# Patient Record
Sex: Female | Born: 1975
Health system: Southern US, Community
[De-identification: ages and names within clinical notes are randomized; demographics above are authoritative.]

## PROBLEM LIST (undated history)

## (undated) DIAGNOSIS — A630 Anogenital (venereal) warts: Secondary | ICD-10-CM

## (undated) DIAGNOSIS — J302 Other seasonal allergic rhinitis: Secondary | ICD-10-CM

## (undated) DIAGNOSIS — T7840XA Allergy, unspecified, initial encounter: Secondary | ICD-10-CM

## (undated) DIAGNOSIS — K2960 Other gastritis without bleeding: Secondary | ICD-10-CM

## (undated) DIAGNOSIS — K648 Other hemorrhoids: Secondary | ICD-10-CM

## (undated) DIAGNOSIS — K219 Gastro-esophageal reflux disease without esophagitis: Secondary | ICD-10-CM

## (undated) HISTORY — DX: Gastro-esophageal reflux disease without esophagitis: K21.9

## (undated) HISTORY — DX: Other gastritis without bleeding: K29.60

## (undated) HISTORY — DX: Anogenital (venereal) warts: A63.0

## (undated) HISTORY — DX: Other seasonal allergic rhinitis: J30.2

## (undated) HISTORY — DX: Allergy, unspecified, initial encounter: T78.40XA

## (undated) HISTORY — DX: Other hemorrhoids: K64.8

## (undated) HISTORY — PX: WISDOM TOOTH EXTRACTION: SHX21

## (undated) HISTORY — PX: TONSILLECTOMY: SHX5217

---

## 2006-04-02 ENCOUNTER — Other Ambulatory Visit: Admission: RE | Admit: 2006-04-02 | Discharge: 2006-04-02 | Payer: Self-pay | Admitting: Family Medicine

## 2007-04-05 ENCOUNTER — Other Ambulatory Visit: Admission: RE | Admit: 2007-04-05 | Discharge: 2007-04-05 | Payer: Self-pay | Admitting: Family Medicine

## 2008-04-19 ENCOUNTER — Other Ambulatory Visit: Admission: RE | Admit: 2008-04-19 | Discharge: 2008-04-19 | Payer: Self-pay | Admitting: Family Medicine

## 2008-04-24 HISTORY — PX: CERVICAL POLYPECTOMY: SHX88

## 2009-04-23 ENCOUNTER — Other Ambulatory Visit: Admission: RE | Admit: 2009-04-23 | Discharge: 2009-04-23 | Payer: Self-pay | Admitting: Family Medicine

## 2009-05-03 DIAGNOSIS — A630 Anogenital (venereal) warts: Secondary | ICD-10-CM

## 2009-05-03 HISTORY — DX: Anogenital (venereal) warts: A63.0

## 2010-04-29 ENCOUNTER — Other Ambulatory Visit: Admission: RE | Admit: 2010-04-29 | Discharge: 2010-04-29 | Payer: Self-pay | Admitting: Family Medicine

## 2011-04-28 ENCOUNTER — Encounter: Payer: Self-pay | Admitting: Family Medicine

## 2011-04-28 ENCOUNTER — Ambulatory Visit (INDEPENDENT_AMBULATORY_CARE_PROVIDER_SITE_OTHER): Payer: 59 | Admitting: Family Medicine

## 2011-04-28 ENCOUNTER — Other Ambulatory Visit (HOSPITAL_COMMUNITY)
Admission: RE | Admit: 2011-04-28 | Discharge: 2011-04-28 | Disposition: A | Discharge status: Home / Self Care | Payer: 59 | Source: Ambulatory Visit | Attending: Family Medicine | Admitting: Family Medicine

## 2011-04-28 VITALS — BP 104/70 | HR 68 | Ht 63.0 in | Wt 105.0 lb

## 2011-04-28 DIAGNOSIS — Z0000 Encounter for general adult medical examination without abnormal findings: Secondary | ICD-10-CM

## 2011-04-28 DIAGNOSIS — Z01419 Encounter for gynecological examination (general) (routine) without abnormal findings: Secondary | ICD-10-CM | POA: Insufficient documentation

## 2011-04-28 LAB — POCT URINALYSIS DIPSTICK
Bilirubin, UA: NEGATIVE
Glucose, UA: NEGATIVE
Leukocytes, UA: NEGATIVE
Nitrite, UA: NEGATIVE

## 2011-04-28 LAB — HM PAP SMEAR: HM Pap smear: NORMAL

## 2011-04-28 NOTE — Progress Notes (Signed)
Katherine Macias is a 35 y.o. female who presents for a complete physical.  She has no specific complaints or concerns  There is no immunization history on file for this patient. TdaP approx 7/06 per pt (through the hospital; [facility] records not yet received) Last Pap smear: 04/2010 Last mammogram: never Last colonoscopy: n/a Last DEXA: n/a Dentist: every 6 months Ophtho: >5 years Exercise: rides bike 2 hours twice a week, and runs 6-7 miles twice a week  History reviewed. No pertinent past medical history.  Past Surgical History  Procedure Date  . Wisdom tooth extraction     History   Social History  . Marital Status: Married    Spouse Name: N/A    Number of Children: 0  . 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 Smoking status: Never Smoker   . Smokeless tobacco: Never Used  . Alcohol Use: Yes     1-2 glasses, 1-2 times per week  . Drug Use: No  . Sexually Active: Yes    Birth Control/ Protection: Other-see comments     husband s/p vasectomy   Other Topics Concern  . Not on file   Social History Narrative  . No narrative on file    Family History  Problem Relation Age of Onset  . Hypertension Mother   . Cancer Mother 26    breast cancer  . Hypertension Father   . Cancer Paternal Aunt     breast  . Diabetes Paternal Grandmother   . Cancer Paternal Aunt     breast    Current outpatient prescriptions:Calcium Carbonate-Vitamin D (CALCIUM + D) 600-200 MG-UNIT TABS, Take 1 tablet by mouth daily.  , Disp: , Rfl: ;  Multiple Vitamins-Calcium (ONE-A-DAY WOMENS FORMULA PO), Take 1 tablet by mouth daily.  , Disp: , Rfl:   No Known Allergies  ROS: The patient denies anorexia, fever, weight changes, headaches,  vision changes, decreased hearing, ear pain, sore throat, breast concerns, chest pain, palpitations, dizziness, syncope, dyspnea on exertion, cough, swelling, nausea, vomiting, diarrhea, constipation,  abdominal pain, melena, hematochezia, indigestion/heartburn, hematuria, incontinence, dysuria, irregular menstrual cycles, vaginal discharge, odor or itch, genital lesions, joint pains, numbness, tingling, weakness, tremor, suspicious skin lesions, depression, anxiety, abnormal bleeding/bruising, or enlarged lymph nodes.  PHYSICAL EXAM: BP 104/70  Pulse 68  Ht 5\' 3"  (1.6 m)  Wt 105 lb (47.628 kg)  BMI 18.60 kg/m2  LMP 04/07/2011  General Appearance:    Alert, cooperative, no distress, appears stated age  Head:    Normocephalic, without obvious abnormality, atraumatic  Eyes:    PERRL, conjunctiva/corneas clear, EOM's intact, fundi    benign  Ears:    Normal TM's and external ear canals  Nose:   Nares normal, mucosa normal, no drainage or sinus   tenderness  Throat:   Lips, mucosa, and tongue normal; teeth and gums normal  Neck:   Supple, no lymphadenopathy;  thyroid:  no   enlargement/tenderness/nodules; no carotid   bruit or JVD  Back:    Spine nontender, no curvature, ROM normal, no CVA     tenderness  Lungs:     Clear to auscultation bilaterally without wheezes, rales or     ronchi; respirations unlabored  Chest Wall:    No tenderness or deformity   Heart:    Regular rate and rhythm, S1 and S2 normal, no murmur, rub   or gallop  Breast Exam:  No tenderness, masses, or nipple discharge or inversion.      No axillary lymphadenopathy  Abdomen:     Soft, non-tender, nondistended, normoactive bowel sounds,    no masses, no hepatosplenomegaly  Genitalia:    Normal external genitalia without lesions.  BUS and vagina normal; cervix with 1mm polyp anteriorly, no other lesions, no cervical motion tenderness. No abnormal vaginal discharge.  Uterus and adnexa not enlarged, nontender, no masses.  Pap performed  Rectal:    Not performed due to age<40 and no related complaints  Extremities:   No clubbing, cyanosis or edema  Pulses:   2+ and symmetric all extremities  Skin:   Skin color, texture,  turgor normal, no rashes or lesions.  A few prominent moles, but no atypical features  Lymph nodes:   Cervical, supraclavicular, and axillary nodes normal  Neurologic:   CNII-XII intact, normal strength, sensation and gait; reflexes 2+ and symmetric throughout          Psych:   Normal mood, affect, hygiene and grooming.     ASSESSMENT/PLAN: 1. Routine general medical examination at a health care facility  POCT urinalysis dipstick, Visual acuity screening, Cytology - PAP    Discussed monthly self breast exams, baseline mammo at age 23, and yearly mammograms after the age of 3; at least 30 minutes of aerobic activity at least 5 days/week; proper sunscreen use reviewed; healthy diet, including goals of calcium and vitamin D intake and alcohol recommendations (less than or equal to 1 drink/day) reviewed; regular seatbelt use; changing batteries in smoke detectors.  Immunization recommendations discussed, UTD.  Colonoscopy recommendations reviewed (age 68).  Recommend routine eye exam

## 2011-04-30 ENCOUNTER — Encounter: Payer: Self-pay | Admitting: Family Medicine

## 2011-05-01 ENCOUNTER — Encounter: Payer: Self-pay | Admitting: Family Medicine

## 2012-04-27 ENCOUNTER — Encounter: Payer: Self-pay | Admitting: Internal Medicine

## 2012-05-05 ENCOUNTER — Encounter: Payer: Self-pay | Admitting: Family Medicine

## 2012-05-05 ENCOUNTER — Ambulatory Visit (INDEPENDENT_AMBULATORY_CARE_PROVIDER_SITE_OTHER): Payer: 59 | Admitting: Family Medicine

## 2012-05-05 VITALS — BP 102/72 | HR 80 | Ht 63.0 in | Wt 105.0 lb

## 2012-05-05 DIAGNOSIS — K219 Gastro-esophageal reflux disease without esophagitis: Secondary | ICD-10-CM

## 2012-05-05 DIAGNOSIS — Z0000 Encounter for general adult medical examination without abnormal findings: Secondary | ICD-10-CM

## 2012-05-05 DIAGNOSIS — G4700 Insomnia, unspecified: Secondary | ICD-10-CM

## 2012-05-05 LAB — POCT URINALYSIS DIPSTICK
Leukocytes, UA: NEGATIVE
Protein, UA: NEGATIVE
Spec Grav, UA: 1.02
Urobilinogen, UA: NEGATIVE

## 2012-05-05 MED ORDER — ZOLPIDEM TARTRATE 10 MG PO TABS: ORAL_TABLET | ORAL | Status: DC

## 2012-05-05 NOTE — Progress Notes (Signed)
Katherine Macias is a 36 y.o. female who presents for a complete physical.  She has the following concerns:  Sometimes has trouble sleeping.  Sometimes comes in spurts, and other times can be about once a week.  Took melatonin for a year, and it helped her sleep during the day when she was working nights, but didn't really help with this intermittent insomnia.  Sometimes is related to anxiety, ie if has a presentation the following day, or lots to do. OTC agents sometimes help, but not when she is very anxious.   She is also reporting some "nonspecific abdominal pain" for 5 months.  Bad for a few days, then better for a week or two, seems to come and go.  The longest is was "bad" was for 5 days.  Discomfort is epigastric.  Sometimes feels crampy, and sometimes radiates to her back.  Other times the pain is burning, but doesn't radiate up into chest, mostly stays in upper stomach.  Worse after eating, and sometimes has nausea and bloating after eating, feels very full, but resolves prior to her next meal.  Health Maintenance: Immunization History  Administered Date(s) Administered  . Td 04/03/2004  Tetanus was given through employee health at hospital (so unclear which type of tetanus was actually given, TD documented in [facility] records, but may not be accurate) gets flu shots yearly through work Last Pap smear: 04/2011 Last mammogram: never Last colonoscopy: n/a Last DEXA: n/a Dentist: every 6 months Ophtho: >6 years Exercise: 4 days/week (running and biking, occasional weights) Lipids 12/07 were excellent (total 146, HDL 83, LDL 48, TG 75) Normal vitamin D 04/2009 (44)  Past Medical History  Diagnosis Date  . Seasonal allergies   . Genital warts 05/2009    treated with liquid nitrogen    Past Surgical History  Procedure Date  . Wisdom tooth extraction   . Tonsillectomy   . Cervical polypectomy 04/24/08    benign    History   Social History  . Marital Status: Married    Spouse Name: N/A     Number of Children: 0  . 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 Smoking status: Never Smoker   . Smokeless tobacco: Never Used  . Alcohol Use: Yes     1-2 glasses, 1-2 times per week  . Drug Use: No  . Sexually Active: Yes    Birth Control/ Protection: Other-see comments     husband s/p vasectomy   Other Topics Concern  . Not on file   Social History Narrative   2 stepchildren    Family History  Problem Relation Age of Onset  . Hypertension Mother   . Cancer Mother 109    breast cancer  . Hypertension Father   . Cancer Paternal Aunt     breast  . Diabetes Paternal Grandmother   . Cancer Paternal Aunt     breast  . Cancer Cousin 33    breast    Current outpatient prescriptions:Calcium-Phosphorus-Vitamin D (CALCIUM GUMMIES PO), Take 1 each by mouth daily., Disp: , Rfl: ;  cetirizine (ZYRTEC) 10 MG tablet, Take 10 mg by mouth daily., Disp: , Rfl: ;  Multiple Vitamins-Calcium (ONE-A-DAY WOMENS FORMULA PO), Take 1 tablet by mouth daily.  , Disp: , Rfl: ;  zolpidem (AMBIEN) 10 MG tablet, 1/2 - 1 tablet at bedtime as needed for insomnia, Disp: 30 tablet, Rfl: 0  No Known Allergies  ROS:  The patient denies anorexia, fever, weight changes, headaches,  vision changes, decreased hearing, ear pain, sore throat, breast concerns, chest pain, palpitations, dizziness, syncope, dyspnea on exertion, cough, swelling, vomiting, diarrhea, constipation, melena, hematochezia, hematuria, incontinence, dysuria, irregular menstrual cycles, vaginal discharge, odor or itch, genital lesions, joint pains, numbness, tingling, weakness, tremor, suspicious skin lesions, depression, abnormal bleeding/bruising, or enlarged lymph nodes. Allergies--controlled by zyrtec (taken prn). +nausea occasionally after meals.  See HPI/above  PHYSICAL EXAM: BP 102/72  Pulse 80  Ht 5\' 3"  (1.6 m)  Wt 105 lb (47.628 kg)  BMI 18.60 kg/m2  LMP  04/26/2012  General Appearance:    Alert, cooperative, no distress, appears stated age  Head:    Normocephalic, without obvious abnormality, atraumatic  Eyes:    PERRL, conjunctiva/corneas clear, EOM's intact, fundi    benign  Ears:    Normal TM's and external ear canals  Nose:   Nares normal, mucosa normal, no drainage or sinus   tenderness  Throat:   Lips, mucosa, and tongue normal; teeth and gums normal  Neck:   Supple, no lymphadenopathy;  thyroid:  no   enlargement/tenderness/nodules; no carotid   bruit or JVD  Back:    Spine nontender, no curvature, ROM normal, no CVA     tenderness  Lungs:     Clear to auscultation bilaterally without wheezes, rales or     ronchi; respirations unlabored  Chest Wall:    No tenderness or deformity   Heart:    Regular rate and rhythm, S1 and S2 normal, no murmur, rub   or gallop  Breast Exam:    No tenderness, masses, or nipple discharge or inversion.      No axillary lymphadenopathy  Abdomen:     Soft, non-tender, nondistended, normoactive bowel sounds,    no masses, no hepatosplenomegaly  Genitalia:    Normal external genitalia without lesions.  BUS and vagina normal; no cervical motion tenderness. No abnormal vaginal discharge.  Uterus and adnexa not enlarged, nontender, no masses.  Pap not performed  Rectal:    Not performed due to age<40 and no related complaints  Extremities:   No clubbing, cyanosis or edema  Pulses:   2+ and symmetric all extremities  Skin:   Skin color, texture, turgor normal, no rashes or lesions  Lymph nodes:   Cervical, supraclavicular, and axillary nodes normal  Neurologic:   CNII-XII intact, normal strength, sensation and gait; reflexes 2+ and symmetric throughout          Psych:   Normal mood, affect, hygiene and grooming.    ASSESSMENT/PLAN:  1. Routine general medical examination at a health care facility  POCT Urinalysis Dipstick, Visual acuity screening  2. Insomnia  zolpidem (AMBIEN) 10 MG tablet  3. GERD  (gastroesophageal reflux disease)     Insomnia, intermittent--discussed ambien vs xanax, given that there is some component of anxiety contributing to inability to fall asleep.  Will try Ambien first, starting at 1/2 tablet prn.  Call for xanax if ambien not effective, or causing side effects. Risks of xanax and ambien reviewed.  GI complaints--most consistent with reflux.  Dietary trial.  Consider use of PPI or H2 blocker prior to meals known to cause symptoms.  Briefly reviewed symptoms of gallstones as well.  Seems localized to upper abdomen, so not likely other causes (gluten intolerance or lactose intolerance).  Recommended keeping track of foods to see which seem to cause the most symptoms, and to avoid.  Discussed monthly self breast  exams and yearly mammograms after the age of 41; advised to get baseline now, given family h/o breast cancer; at least 30 minutes of aerobic activity at least 5 days/week; proper sunscreen use reviewed; healthy diet, including goals of calcium and vitamin D intake and alcohol recommendations (less than or equal to 1 drink/day) reviewed; regular seatbelt use; changing batteries in smoke detectors.  Immunization recommendations discussed--check with employee health re: type and date of last tetanus shot.  Colonoscopy recommendations reviewed--age 60

## 2012-05-05 NOTE — Patient Instructions (Addendum)
HEALTH MAINTENANCE RECOMMENDATIONS:  It is recommended that you get at least 30 minutes of aerobic exercise at least 5 days/week (for weight loss, you may need as much as 60-90 minutes). This can be any activity that gets your heart rate up. This can be divided in 10-15 minute intervals if needed, but try and build up your endurance at least once a week.  Weight bearing exercise is also recommended twice weekly.  Eat a healthy diet with lots of vegetables, fruits and fiber.  "Colorful" foods have a lot of vitamins (ie green vegetables, tomatoes, red peppers, etc).  Limit sweet tea, regular sodas and alcoholic beverages, all of which has a lot of calories and sugar.  Up to 1 alcoholic drink daily may be beneficial for women (unless trying to lose weight, watch sugars).  Drink a lot of water.  Calcium recommendations are 1200-1500 mg daily (1500 mg for postmenopausal women or women without ovaries), and vitamin D 1000 IU daily.  This should be obtained from diet and/or supplements (vitamins), and calcium should not be taken all at once, but in divided doses.  Monthly self breast exams and yearly mammograms for women over the age of 77 is recommended.  Sunscreen of at least SPF 30 should be used on all sun-exposed parts of the skin when outside between the hours of 10 am and 4 pm (not just when at beach or pool, but even with exercise, golf, tennis, and yard work!)  Use a sunscreen that says "broad spectrum" so it covers both UVA and UVB rays, and make sure to reapply every 1-2 hours.  Remember to change the batteries in your smoke detectors when changing your clock times in the spring and fall.  Use your seat belt every time you are in a car, and please drive safely and not be distracted with cell phones and texting while driving.  Please check with employee health regarding your tetanus vaccine--was it Td or TdaP.  If it was plain tetanus (Td), then TdaP booster is recommended, and nurse visit can be  scheduled.  If you had TdaP, then booster isn't needed until 2015.  Diet for GERD or PUD Nutrition therapy can help ease the discomfort of gastroesophageal reflux disease (GERD) and peptic ulcer disease (PUD).  HOME CARE INSTRUCTIONS   Eat your meals slowly, in a relaxed setting.   Eat 5 to 6 small meals per day.   If a food causes distress, stop eating it for a period of time.  FOODS TO AVOID  Coffee, regular or decaffeinated.   Cola beverages, regular or low calorie.   Tea, regular or decaffeinated.   Pepper.   Cocoa.   High fat foods, including meats.   Butter, margarine, hydrogenated oil (trans fats).   Peppermint or spearmint (if you have GERD).   Fruits and vegetables if not tolerated.   Alcohol.   Nicotine (smoking or chewing). This is one of the most potent stimulants to acid production in the gastrointestinal tract.   Any food that seems to aggravate your condition.  If you have questions regarding your diet, ask your caregiver or a registered dietitian. TIPS  Lying flat may make symptoms worse. Keep the head of your bed raised 6 to 9 inches (15 to 23 cm) by using a foam wedge or blocks under the legs of the bed.   Do not lay down until 3 hours after eating a meal.   Daily physical activity may help reduce symptoms.  MAKE SURE YOU:  Understand these instructions.   Will watch your condition.   Will get help right away if you are not doing well or get worse.  Document Released: 10/20/2005 Document Revised: 10/09/2011 Document Reviewed: 09/05/2011 [facility] Patient Information 2012 [place], [place].  Consider use of PPI or H2 blocker prior to meals known to cause symptoms.

## 2012-05-18 ENCOUNTER — Other Ambulatory Visit: Payer: Self-pay | Admitting: Family Medicine

## 2012-05-18 DIAGNOSIS — Z1231 Encounter for screening mammogram for malignant neoplasm of breast: Secondary | ICD-10-CM

## 2012-05-18 DIAGNOSIS — Z803 Family history of malignant neoplasm of breast: Secondary | ICD-10-CM

## 2012-05-31 ENCOUNTER — Ambulatory Visit
Admission: RE | Admit: 2012-05-31 | Discharge: 2012-05-31 | Disposition: A | Discharge status: Home / Self Care | Payer: 59 | Source: Ambulatory Visit | Attending: Family Medicine | Admitting: Family Medicine

## 2012-05-31 DIAGNOSIS — Z1231 Encounter for screening mammogram for malignant neoplasm of breast: Secondary | ICD-10-CM

## 2012-05-31 DIAGNOSIS — Z803 Family history of malignant neoplasm of breast: Secondary | ICD-10-CM

## 2012-06-24 ENCOUNTER — Ambulatory Visit (INDEPENDENT_AMBULATORY_CARE_PROVIDER_SITE_OTHER): Payer: 59 | Admitting: Family Medicine

## 2012-06-24 ENCOUNTER — Encounter: Payer: Self-pay | Admitting: Family Medicine

## 2012-06-24 VITALS — BP 116/78 | HR 80 | Ht 63.0 in | Wt 107.0 lb

## 2012-06-24 DIAGNOSIS — K219 Gastro-esophageal reflux disease without esophagitis: Secondary | ICD-10-CM

## 2012-06-24 DIAGNOSIS — R1084 Generalized abdominal pain: Secondary | ICD-10-CM | POA: Insufficient documentation

## 2012-06-24 DIAGNOSIS — G4700 Insomnia, unspecified: Secondary | ICD-10-CM

## 2012-06-24 DIAGNOSIS — R109 Unspecified abdominal pain: Secondary | ICD-10-CM

## 2012-06-24 DIAGNOSIS — J309 Allergic rhinitis, unspecified: Secondary | ICD-10-CM | POA: Insufficient documentation

## 2012-06-24 DIAGNOSIS — R51 Headache: Secondary | ICD-10-CM

## 2012-06-24 NOTE — Patient Instructions (Signed)
Lactose-Free Diet Lactose is a carbohydrate that is found mainly in milk and milk products, as well as in foods with added milk or whey. Lactose must be digested by the enzyme lactase in order to be used by the body. Lactose intolerance occurs when there is a shortage of lactase. When your body is not able to digest lactose, you may feel sick to your stomach (nausea), bloated, and have cramps, gas, and diarrhea. TYPES OF LACTASE DEFICIENCY  Primary lactase deficiency. This is the most common type. It is characterized by a slow decrease in lactase activity.   Secondary lactase deficiency. This occurs following injury to the small intestinal mucosa as a result of a disease or condition. It can also occur as a result of surgery or after treatment with antibiotic medicines or cancer drugs.  Tolerance to lactose varies widely. Each person must determine how much milk can be consumed without developing symptoms. Drinking smaller portions of milk throughout the day may be helpful. Some studies suggest that slowing gastric emptying may help increase tolerance of milk products. This may be done by:  Consuming milk or milk products with a meal rather than alone.   Consuming milk with a higher fat content.  There are many dairy products that may be tolerated better than milk by some people, including:  Cheese (especially aged cheese). The lactose content is much lower than in milk.   Cultured dairy products, such as yogurt, buttermilk, cottage cheese, and sweet acidophilus milk (kefir). These products are usually well tolerated by lactase-deficient people. This is because the healthy bacteria help digest lactose.   Lactose-hydrolyzed milk. This product contains 40% to 90% less lactose than milk and may also be well tolerated.  ADEQUACY These diets may be deficient in calcium, riboflavin, and vitamin D, according to the Recommended Dietary Allowances of the [employer]. Depending on individual  tolerances and the use of milk substitutes, milk, or other dairy products, you may be able to meet these recommendations. SPECIAL NOTES  Lactose is a carbohydrate. The main food source for lactose is dairy products. Reading food labels is important. Many products contain lactose even when they are not made from milk. Look for the following words: whey, milk solids, dry milk solids, nonfat dry milk powder. Typical sources of lactose other than dairy products include breads, candies, cold cuts, prepared and processed foods, and commercial sauces and gravies.   All foods must be prepared without milk, cream, or other dairy foods.   A vitamin or mineral supplement may be necessary. Consult your caregiver or Registered Dietitian.   Lactose is also found in many prescription and over-the-counter medicines.   Soy milk and lactose-free supplements may be used as an alternative to milk.  CHOOSING FOODS Breads and Starches  Allowed: Breads and rolls made without milk. [place], [place], or [place] bread. Soda crackers, graham crackers. Any crackers prepared without lactose. Cooked or dry cereals prepared without lactose (read labels). Any potatoes, pasta, or rice prepared without milk or lactose. Popcorn.   Avoid: Breads and rolls that contain milk. Prepared mixes such as muffins, biscuits, waffles, pancakes. Sweet rolls, donuts, [place] toast (if made with milk or lactose). Zwieback crackers, corn curls, or any crackers that contain lactose. Cooked or dry cereals prepared with lactose (read labels). Instant potatoes, frozen [place] fries, scalloped or au gratin potatoes.  Vegetables  Allowed: Fresh, frozen, and canned vegetables.   Avoid: Creamed or breaded vegetables. Vegetables in a cheese sauce or with lactose-containing margarines.  Fruit  Allowed: All fresh, canned, or frozen fruits that are not processed with lactose.   Avoid: Any canned or frozen fruits processed with lactose.  Meat and Meat  Substitutes  Allowed: Plain beef, chicken, fish, [place], lamb, veal, pork, or ham. Kosher prepared meat products. Strained or junior meats that do not contain milk. Eggs, soy meat substitutes, nuts.   Avoid: Scrambled eggs, omelets, and souffles that contain milk. Creamed or breaded meat, fish, or fowl. Sausage products such as wieners, liver sausage, or cold cuts that contain milk solids. Cheese, cottage cheese, or cheese spreads.  Milk  Allowed: None.   Avoid: Milk (whole, 2%, skim, or chocolate). Evaporated, powdered, or condensed milk. Malted milk.  Soups and Combination Foods  Allowed: Bouillon, broth, vegetable soups, clear soups, consomms. Homemade soups made with allowed ingredients. Combination or prepared foods that do not contain milk or milk products (read labels).   Avoid: Cream soups, chowders, commercially prepared soups containing lactose. Macaroni and cheese, pizza. Combination or prepared foods that contain milk or milk products.  Desserts and Sweets  Allowed: Water and fruit ices, gelatin, angel food cake. Homemade cookies, pies, or cakes made from allowed ingredients. Pudding (if made with water or a milk substitute). Lactose-free tofu desserts. Sugar, honey, corn syrup, jam, jelly, marmalade, molasses (beet sugar). Pure sugar candy, marshmallows.   Avoid: Ice cream, ice milk, sherbet, custard, pudding, frozen yogurt. Commercial cake and cookie mixes. Desserts that contain chocolate. Pie crust made with milk-containing margarine. Reduced calorie desserts made with a sugar substitute that contains lactose. Toffee, peppermint, butterscotch, chocolate, caramels.  Fats and Oils  Allowed: Butter (as tolerated, contains very small amounts of lactose). Margarines and dressings that do not contain milk. Vegetable oils, shortening, mayonnaise, nondairy cream and whipped toppings without lactose or milk solids added. Tomasa Blase.   Avoid: Margarines and salad dressings containing milk.  Cream, cream cheese, peanut butter with added milk solids, sour cream, chip dips made with sour cream.  Beverages  Allowed: Carbonated drinks, tea, coffee and freeze-dried coffee, some instant coffees (check labels). Fruit drinks, fruit and vegetable juice, rice or soy milk.   Avoid: Hot chocolate. Some cocoas, some instant coffees, instant iced teas, powdered fruit drinks (read labels).  Condiments  Allowed: Soy sauce, carob powder, olives, gravy made with water, baker's cocoa, pickles, pure seasonings and spices, wine, pure monosodium glutamate, catsup, mustard.   Avoid: Some chewing gums, chocolate, some cocoas. Certain antibiotics and vitamin or mineral preparations. Spice blends if they contain milk products. MSG extender. Artificial sweeteners that contain lactose. Some nondairy creamers (read labels).  SAMPLE MENU Breakfast  Orange juice.   Banana.   Bran cereal.   Nondairy creamer.   [place]ienna bread, toasted.   Butter or milk-free margarine.   Coffee or tea.  Lunch  Chicken breast.   Rice.   Green beans.   Butter or milk-free margarine.   Fresh melon.   Coffee or tea.  Dinner  [employer]ing.   Baked potato.   Butter or milk-free margarine.   Broccoli.   Lettuce salad with vinegar and oil dressing.   [employer].   Coffee or tea.  Document Released: 04/11/2002 Document Revised: 10/09/2011 Document Reviewed: 01/17/2011 [facility] Patient Information 2012 [place], [place].   Gluten-Free Diet Gluten is a protein found in many grains. Gluten is present in wheat, rye, and barley. Gluten may cause intestinal injury when ingested by people who are sensitive to gluten. A tissue sample (biopsy) of the small intestine is  usually required for a positive diagnosis of gluten sensitivity. Dietary treatment consists of eliminating foods and food ingredients from wheat, rye, and barley. When these are excluded completely from the diet, most patients regain function of  the small intestine. Strict compliance is important even during symptom-free periods. Gluten sensitive patients must realize that this is a lifelong diet. During the first stages of treatment, some people will also need to restrict dairy products that contain lactose, which is a naturally occurring sugar. Lactose is difficult to absorb when the small intestines are damaged. This is called lactose intolerance. WHY YOU NEED THIS DIET Ask your caregiver to explain the conditions that you may have.  Celiac disease / nontropical sprue / gluten-sensitive enteropathy.   Dermatitis herpetiformis.  SPECIAL NOTES  Gluten from wheat, rye, and barley protein interferes with absorbing food in individuals with gluten sensitivity. It is important to read all labels, as gluten may have been added as an incidental ingredient. Words to check for on the label include: flour, starch, durum flour, graham flour, phosphated flour, self-rising flour, semolina, farina, modified food starch, cereal, thickening, fillers, emulsifiers, malt flavoring, hydrolyzed vegetable protein. A Registered Dietician can help you identify possible harmful ingredients in the foods you normally eat.   If you are not sure whether an ingredient contains gluten, be sure to check with the manufacturer. Note that some manufacturers may change ingredients without notice. Always read labels.  Since flour and cereal products are quite often used in the preparation of foods, it is important to be aware of the methods of preparation used, as well as the foods themselves. This is especially true when you are dining out. Starches  Allowed: Only those prepared from arrowroot, corn, potato, rice, and bean flours. Rice wafers (*); pure cornmeal tortillas; popcorn; some crackers and chips(*). Hot cereals made from cornmeal; Cream of Rice. Cold cereals such as puffed rice, Kellogg's Sugar Pops, Post's Fruity & Chocolate Pebbles, [employer] and  [employer], [employer]; [employer] Cocoa Puffs, and Gluten-free Oatmeal. White or sweet potatoes; yams; hominy; rice or wild rice; special gluten-free pasta. Some oriental rice noodles or bean noodles.   Avoid: All wheat, and rye cereals; wheat germ, barley, bran, graham, malt, bulgur, and millet (-). NOTE: Avoid cereals containing malt as a flavoring such as Rice Krispies. Regular noodles, spaghetti, macaroni, most packaged rice mixes(*). All others containing wheat, rye, and/or barley.  Vegetables  Allowed: All plain, fresh, frozen, or canned vegetables.   Avoid: Creamed vegetables(*), vegetables canned in sauces(*). Any prepared with wheat, rye, or barley.  Fruit  Allowed: All fresh, frozen, canned, or dried fruits. Fruit juices.   Avoid: Thickened or prepared fruits; some pie fillings(*).  Meat and Meat Substitutes  Allowed: Meat, fish, poultry, or eggs prepared without added wheat, rye, or barley; luncheon meat(*), frankfurters(*), and pure meat. All aged cheese, processed cheese products(*). Cottage cheese(+), cream cheese(+). Dried beans and peas; lentils.   Avoid: Any meat or meat alternate containing wheat, rye, barley, or gluten stabilizers; Bread-containing products such as Swiss steak, croquettes, and meatloaf. Tuna canned in vegetable broth(*); [place] with HVP injected as part of the basting; any cheese product containing oat gum as an ingredient.  Milk  Allowed: Milk. Yogurt made with allowed ingredients(*).   Avoid: Commercial chocolate milk which may have cereal added(*). Malted milk.  Soups and Combination Foods  Allowed: Homemade broth and soups made with allowed ingredients; some canned or frozen soups are allowed(*). Combination or  prepared foods that do not contain gluten(*). Read labels.   Avoid: All soups containing wheat, rye, or barley flour. Bouillon and bouillon cubes that contain hydrolyzed vegetable protein (HVP). Combination or prepared  foods that do contain gluten(*).  Desserts  Allowed: Custard, junket, homemade puddings from cornstarch, rice, and tapioca; some pudding mixes(*). Gelatin desserts, ices, and sherbet(*). Cake, cookies, and other desserts prepared with allowed flours.Some commercial ice creams(*).   Avoid: Cakes, cookies, doughnuts, pastries, etc., prepared with wheat, rye, and/or barley flour. Some commercial ice creams(*), ice cream flavors which contain cookies, crumbs, or cheesecake(*); ice cream cones. All commercially prepared mixes for cakes, cookies, and other desserts(*); bread pudding; puddings thickened with flour.  Sweets  Allowed: Sugar, honey, syrup(*), molasses, jelly, jam, plain hard candy, marshmallows, gumdrops, homemade candies free from wheat, rye, or barley. Coconut.   Avoid: Commercial candies containing wheat, rye, or barley(*). Gypsy [place]ore is dusted with wheat flour. Chocolate-coated nuts, which are often rolled in flour.  Fats and Oils  Allowed: Butter, margarine, vegetable oil, sour cream(+), whipping cream, shortening, lard, cream, mayonnaise(*). Some commercial salad dressings(*). Peanut butter.   Avoid: Some commercial salad dressings(*).  Beverages  Allowed: Coffee (regular or decaffeinated), tea, herbal tea (read label to be sure that no wheat flour has been added). Carbonated beverages; some root beers(*).   Avoid: Cereal beverages such as Postum or Ovaltine ; beer (unless Gluten free), ale, malted milk; some root beers, wine, and sake.  Condiments  Allowed: Salt, pepper, herbs, spices, extracts, food colorings; monosodium glutamate (MSG); cider, rice, and wine vinegar; bicarbonate of soda; baking powder; Chun King soy sauce; nuts, coconut, chocolate, and pure cocoa powder.   Avoid: Some curry powder (*), some dry seasoning mixes (*), some gravy extracts (*), some meat sauces (*), some catsup (*), some prepared mustard (*), horseradish (*), some soy sauce (*), chip dips (*),  some chewing gum (*). Yeast extract (contains barley). Caramel color (may contain malt).  Flour and thickening agents Allowed: Arrowroot starch (A), Corn bran (B), Corn flour (B,C,D), Corn germ (B), Cornmeal (B,C,D), Corn starch (A), Potato flour (B,C,E), Potato starch flour (B,C,E), Rice bran (B), Rice flours: Plain, brown (B,C,D,E) Sweet (A,B,C,F). Rice polish (B,C,G), Soy flour (B,C,G), Tapioca starch (A). ARE GOOD FOR: (A) Good thickening agent (B) Good when combined with other flours (C) Best combined with milk and eggs in baked products (D) Best in grainy-textured products (E) Produces drier product than other flours (F) Produces moister product than other flours (G) Adds distinct flavor to product; use in moderation. (*) Check labels and investigate any questionable ingredients.  (-) Additional research is needed before this product can be recommended. (+) Check vegetable gum used. * These amounts indicate the minimum number of servings needed from the basic food groups to provide a variety of nutrients essential to good health. The word "maximum" is used if amounts of certain foods eaten must be controlled. Combination foods may count as full or partial servings from the food groups. Dark green, leafy, or orange vegetables are recommended 3 or 4 times weekly to provide vitamin A. A good source of vitamin C is recommended daily.  NOTE: Brand names are used for clarification only, and do not constitute an endorsement. Also, ingredients may be changed by the manufacturer without notice. Always read labels. SAMPLE MEAL PLAN Breakfast   Fruit or juice.   Cereal (from allowed grains).   Toast (from allowed grains).   Heart-healthy tub margarine   Jam or jelly.  Milk beverage.  Lunch  Meat or meat substitute.   Gluten-free bread.   Vegetable or salad.   Heart-healthy margarine.   Fruit or dessert.   Milk beverage.  Dinner  Meat or meat substitute.   Potato or rice.     Salad with dressing or soup.   Gluten-free bread.   Vegetable.   Fruit or dessert.   Heart-healthy margarine.   Beverage.  SAMPLE MENU Breakfast   Orange juice.   Banana.   Rice or corn cereal.   Toast (gluten-free bread).   Heart-healthy tub margarine.   Jam.   Milk.   Coffee or tea.  Lunch  Chicken salad sandwich (with gluten-free bread and mayonnaise).   Sliced tomatoes.   Heart-healthy tub margarine.   Apple.   Milk.   Coffee or tea.  Dinner  [employer]oeing.   Baked potato.   Broccoli.   Lettuce salad with gluten-free dressing.   Gluten-free bread.   Custard.   Heart-healthy margarine.   Coffee or tea.  These meal plans are provided as samples. Your daily meal plans will vary. Document Released: 10/20/2005 Document Revised: 10/09/2011 Document Reviewed: 09/22/2011 [facility] Patient Information 2012 [place], [place].

## 2012-06-24 NOTE — Progress Notes (Signed)
Chief Complaint  Patient presents with  . Follow-up    on stomach issues.   HPI: Patient presents for follow up on GI complaints.  She denies any improvement.  She made changes to her diet consistent with reflux precautions as discussed at her last visit.  Stopped drinking soda, cut back on coffee to 1/2 cup each morning.  Avoiding tomatoes, spicy foods, mints.  Tried prilosec OTC x 14 days and really hasn't noticed any difference in her symptoms.    Sometimes still has upper stomach/epigastric discomfort, but can be with any foods, and even with first bite of food.  Now has noticed that her pain is more diffuse throughout her abdomen.  Feels bloated after eating, feels full, not gassy.  Lasts a few hours, then when it stops, she is hungry.  Doesn't notice any relation or worsening of symptoms related to dairy.  Hasn't tried gluten-free or lactose-free diet. Bowels are normal, a little looser than in the past, but not at all like diarrhea.  Sometimes notices some blood with bowel movements, thinks she recalls being told of having a hemorrhoid in past--occurs about every other stool. Occasional nausea, mild, more constant, not necessarily after eating.  Katherine Macias is working well, has only used it about 3 times since her physical (every 2 weeks or so).  Also has been having some headaches, mostly across her forehead.  Relieved by tylenol.  Better if she rubs her forehead.  Headaches develop later in the day, not upon waking up.  Feels like a hangover headache. Headaches got worse after cutting back on caffeine, but has persisted.  Sometimes feels "fuzzy".  Denies congestion, allergy symptoms.  Stopped taking zyrtec about a month ago because wasn't having problems. Denies eye strain.  Past Medical History  Diagnosis Date  . Seasonal allergies   . Genital warts 05/2009    treated with liquid nitrogen   Past Surgical History  Procedure Date  . Wisdom tooth extraction   . Tonsillectomy   .  Cervical polypectomy 04/24/08    benign   History   Social History  . Marital Status: Married    Spouse Name: N/A    Number of Children: 0  . Years of Education: N/A   Occupational History  . 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 Smoking status: Never Smoker   . Smokeless tobacco: Never Used  . Alcohol Use: Yes     1-2 glasses, 1-2 times per week  . Drug Use: No  . Sexually Active: Yes    Birth Control/ Protection: Other-see comments     husband s/p vasectomy   Other Topics Concern  . Not on file   Social History Narrative   2 stepchildren   Current Outpatient Prescriptions on File Prior to Visit  Medication Sig Dispense Refill  . Calcium-Phosphorus-Vitamin D (CALCIUM GUMMIES PO) Take 1 each by mouth daily.      . cetirizine (ZYRTEC) 10 MG tablet Take 10 mg by mouth daily.      . Multiple Vitamins-Calcium (ONE-A-DAY WOMENS FORMULA PO) Take 1 tablet by mouth daily.        Marland Kitchen zolpidem (AMBIEN) 10 MG tablet 1/2 - 1 tablet at bedtime as needed for insomnia  30 tablet  0   No Known Allergies  ROS:  Denies fevers, sore throat, ear pain, significant congestion, sinus pain.  Denies cough, shortness of breath, chest pain.  Denies heartburn.  +nausea, occasional loose stools and BRBPR.  Denies urinary  complaints.  Denies mucus in stool.  No vaginal discharge or other complaints except as per HPI.  PHYSICAL EXAM: BP 116/78  Pulse 80  Ht 5\' 3"  (1.6 m)  Wt 107 lb (48.535 kg)  BMI 18.95 kg/m2  LMP 06/14/2012 Pleasant, well appearing female in no distress HEENT:  PERRL, EOMI, conjunctiva clear.  TM's and EAC's normal.  Nasal mucosa mildly edematous, L>R, clear mucus.  Sinuses nontender.  OP clear Neck: no lympadenopathy, thyromegaly or mass Heart: regular rate and rhythm without murmur Lungs: clear bilaterally Abdomen: soft, normal bowel sounds.  Very mild epigastric discomfort.  No organomegaly or mass, guarding, rebound Extremities: no edema Psych: normal  mood, affect, hygiene and grooming  ASSESSMENT/PLAN: 1. Abdominal pain, diffuse  Ambulatory referral to Gastroenterology  2. GERD (gastroesophageal reflux disease)    3. Insomnia    4. Frontal headache    5. Allergic rhinitis, cause unspecified     Discussed trial of lactose-free diet x 1 week.  If still has persistent GI complaints, then can try gluten-free diet.  Will refer to Dr. Juanda Chance for full evaluation--will set appt up for at least 2 weeks away, to give time for dietary trial.  If not improved, will need lab eval, and possibly further studies such as EGD/colonoscopy depending on her symptoms and Dr. Regino Schultze evaluation/recommendation.  Will let GI order labs/studies, and pt to do dietary trials in interim.    Headaches--restart zyrtec as headaches may be related to frontal sinuses.  Decongestant prn.  Insomnia--intermittent, treated with good results with prn ambien.

## 2012-07-04 DIAGNOSIS — K2960 Other gastritis without bleeding: Secondary | ICD-10-CM

## 2012-07-04 DIAGNOSIS — K648 Other hemorrhoids: Secondary | ICD-10-CM

## 2012-07-04 HISTORY — DX: Other hemorrhoids: K64.8

## 2012-07-04 HISTORY — DX: Other gastritis without bleeding: K29.60

## 2012-07-04 HISTORY — PX: ESOPHAGOGASTRODUODENOSCOPY: SHX1529

## 2012-07-04 HISTORY — PX: FLEXIBLE SIGMOIDOSCOPY: SHX1649

## 2012-07-21 ENCOUNTER — Encounter: Payer: Self-pay | Admitting: Internal Medicine

## 2012-07-22 ENCOUNTER — Ambulatory Visit (INDEPENDENT_AMBULATORY_CARE_PROVIDER_SITE_OTHER): Payer: 59 | Admitting: Internal Medicine

## 2012-07-22 ENCOUNTER — Encounter: Payer: Self-pay | Admitting: Internal Medicine

## 2012-07-22 ENCOUNTER — Other Ambulatory Visit (INDEPENDENT_AMBULATORY_CARE_PROVIDER_SITE_OTHER): Payer: 59

## 2012-07-22 VITALS — BP 98/60 | HR 72 | Ht 63.0 in | Wt 106.0 lb

## 2012-07-22 DIAGNOSIS — R197 Diarrhea, unspecified: Secondary | ICD-10-CM

## 2012-07-22 DIAGNOSIS — R1013 Epigastric pain: Secondary | ICD-10-CM

## 2012-07-22 DIAGNOSIS — R110 Nausea: Secondary | ICD-10-CM

## 2012-07-22 DIAGNOSIS — K625 Hemorrhage of anus and rectum: Secondary | ICD-10-CM

## 2012-07-22 LAB — COMPREHENSIVE METABOLIC PANEL
Alkaline Phosphatase: 43 U/L (ref 39–117)
BUN: 13 mg/dL (ref 6–23)
Glucose, Bld: 89 mg/dL (ref 70–99)
Total Bilirubin: 0.9 mg/dL (ref 0.3–1.2)

## 2012-07-22 LAB — CBC
HCT: 42.8 % (ref 36.0–46.0)
MCHC: 32.9 g/dL (ref 30.0–36.0)
MCV: 93.4 fl (ref 78.0–100.0)
Platelets: 208 10*3/uL (ref 150.0–400.0)
RBC: 4.58 Mil/uL (ref 3.87–5.11)

## 2012-07-22 LAB — TSH: TSH: 1.03 u[IU]/mL (ref 0.35–5.50)

## 2012-07-22 LAB — FERRITIN: Ferritin: 19 ng/mL (ref 10.0–291.0)

## 2012-07-22 LAB — IGA: IgA: 306 mg/dL (ref 68–378)

## 2012-07-22 NOTE — Progress Notes (Signed)
Patient ID: Katherine Macias, female   DOB: September 03, 1976, 36 y.o.   MRN: [id number]  SUBJECTIVE: HPI Katherine Macias is a 36 yo female with little PMH who is seen in consultation at the request of Dr. Lynelle Doctor for evaluation of epigastric abdominal pain and nausea. The patient reports a history of intermittent epigastric abdominal pain/discomfort and nausea which was occurring 2-3 times a month. However, over the last 3 months the symptoms have worsened. She reports epigastric abdominal pain on most days, and occasionally lower abdominal pain. This pain does not seem to be affected by eating or bowel movement. This pain usually last most the day and does not seem to come and go. She does feel full with abdominal bloating, but still has a "fine appetite" and a stable weight. Her nausea last most of the day but she has not had vomiting. She isn't not sure she's had heartburn, but she does report occasional substernal burning pain, but denies water brash. She give a trial of Prilosec for 14 days without benefit or change in symptoms. She also tried ranitidine which also did not help very much. For 14 days she practiced GERD hygiene strictly without much benefit. She also performed the lactose-free trial for 7 days without benefit and a gluten-free trial for 7 days with no benefit. Regarding her bowel habits they've been "roughly the same" but she does occasionally have loose stool. She denies diarrhea and constipation. No nocturnal stools. She reports occasional black stools, the last 2 days ago. She is also seen bright red blood in her stool on occasion. She reports about a month ago she was having red blood with every bowel movement for a period of about a month. This was painless. She denies tenesmus. She does report a history of hemorrhoids, but this was years ago, and they were painful. No fevers or chills. No rashes. No joint pains. No mouth ulcers.   Review of Systems  As per history of present illness, otherwise  negative   Past Medical History  Diagnosis Date  . Seasonal allergies   . Genital warts 05/2009    treated with liquid nitrogen    Current Outpatient Prescriptions  Medication Sig Dispense Refill  . Calcium-Phosphorus-Vitamin D (CALCIUM GUMMIES PO) Take 1 each by mouth daily.      . cetirizine (ZYRTEC) 10 MG tablet Take 10 mg by mouth daily.      . Multiple Vitamins-Calcium (ONE-A-DAY WOMENS FORMULA PO) Take 1 tablet by mouth daily.        Marland Kitchen zolpidem (AMBIEN) 10 MG tablet 1/2 - 1 tablet at bedtime as needed for insomnia  30 tablet  0    No Known Allergies  Family History  Problem Relation Age of Onset  . Hypertension Mother   . Breast cancer Mother 46  . Hypertension Father   . Breast cancer Paternal Aunt   . Diabetes Paternal Grandmother   . Breast cancer Paternal Aunt   . Breast cancer Cousin 33  . Colon cancer Neg Hx   --neg for IBD and celiac  History  Substance Use Topics  . Smoking status: Never Smoker   . Smokeless tobacco: Never Used  . Alcohol Use: Yes     1-2 glasses, 1-2 times per week    OBJECTIVE: BP 98/60  Pulse 72  Ht 5\' 3"  (1.6 m)  Wt 106 lb (48.081 kg)  BMI 18.78 kg/m2  LMP 06/14/2012 Constitutional: Well-developed and well-nourished. No distress. HEENT: Normocephalic and atraumatic. Oropharynx is clear and moist.  No oropharyngeal exudate. Conjunctivae are normal. Pupils are equal round and reactive to light. No scleral icterus. Neck: Neck supple. Trachea midline. Cardiovascular: Normal rate, regular rhythm and intact distal pulses. No M/R/G Pulmonary/chest: Effort normal and breath sounds normal. No wheezing, rales or rhonchi. Abdominal: Soft, tan diffuse tenderness without rebound or guarding, nondistended. Bowel sounds active throughout. There are no masses palpable. No hepatosplenomegaly. Extremities: no clubbing, cyanosis, or edema Lymphadenopathy: No cervical adenopathy noted. Neurological: Alert and oriented to person place and time. Skin:  Skin is warm and dry. No rashes noted. Psychiatric: Normal mood and affect. Behavior is normal.  Labs -- pending   ASSESSMENT AND PLAN: 36 yo female with little PMH who is seen in consultation at the request of Dr. Lynelle Doctor for evaluation of epigastric abdominal pain and nausea.  1. Epigastric pain/nausea/bloating -- the patient's symptoms is consistent with dyspepsia, but she did not respond to a 14 day trial of PPI. Celiac disease is also in the differential, as is inflammatory bowel disease, but IBD is felt less likely.   We discussed other evaluation, and I will order labs today to include CMP, CBC, TSH, celiac panel. I've also recommended upper endoscopy given her epigastric pain, nausea, and black stool. Also, given her rectal bleeding, we will further evaluate this with flexible sigmoidoscopy. These can be performed at the same time in our endoscopy unit with propofol sedation. We discussed a trial of more potent PPI, but decided to defer this until after her endoscopies. She will call us back if symptoms worsen prior to endoscopy.

## 2012-07-22 NOTE — Patient Instructions (Addendum)
You have been scheduled for a Endoscopy/Flexible sigmoidoscopy with propofol. Please follow written instructions given to you at your visit today.  Please pick up your prep kit at the pharmacy within the next 1-3 days. If you use inhalers (even only as needed), please bring them with you on the day of your procedure.  Your physician has requested that you go to the basement for lab work before leaving today.

## 2012-07-23 LAB — TISSUE TRANSGLUTAMINASE, IGA: Tissue Transglutaminase Ab, IgA: 4.5 U/mL (ref <20)

## 2012-07-26 ENCOUNTER — Ambulatory Visit (AMBULATORY_SURGERY_CENTER): Payer: 59 | Admitting: Internal Medicine

## 2012-07-26 ENCOUNTER — Encounter: Payer: Self-pay | Admitting: Internal Medicine

## 2012-07-26 VITALS — BP 119/71 | HR 74 | Temp 98.5°F | Resp 22 | Ht 63.0 in | Wt 106.0 lb

## 2012-07-26 DIAGNOSIS — R197 Diarrhea, unspecified: Secondary | ICD-10-CM

## 2012-07-26 DIAGNOSIS — K219 Gastro-esophageal reflux disease without esophagitis: Secondary | ICD-10-CM

## 2012-07-26 DIAGNOSIS — K2970 Gastritis, unspecified, without bleeding: Secondary | ICD-10-CM

## 2012-07-26 DIAGNOSIS — K625 Hemorrhage of anus and rectum: Secondary | ICD-10-CM

## 2012-07-26 DIAGNOSIS — K2990 Gastroduodenitis, unspecified, without bleeding: Secondary | ICD-10-CM

## 2012-07-26 DIAGNOSIS — R1013 Epigastric pain: Secondary | ICD-10-CM

## 2012-07-26 MED ORDER — ESOMEPRAZOLE MAGNESIUM 40 MG PO CPDR: 40.0000 mg | DELAYED_RELEASE_CAPSULE | Freq: Every day | ORAL | Status: DC

## 2012-07-26 MED ORDER — SODIUM CHLORIDE 0.9 % IV SOLN: 500.0000 mL | INTRAVENOUS | Status: DC

## 2012-07-26 MED ORDER — HYDROCORTISONE ACETATE 25 MG RE SUPP: 25.0000 mg | Freq: Two times a day (BID) | RECTAL | Status: DC

## 2012-07-26 NOTE — Progress Notes (Signed)
Patient did not experience any of the following events: a burn prior to discharge; a fall within the facility; wrong site/side/patient/procedure/implant event; or a hospital transfer or hospital admission upon discharge from the facility. (G8907) Patient did not have preoperative order for IV antibiotic SSI prophylaxis. (G8918)  

## 2012-07-26 NOTE — Op Note (Signed)
[facility] [address] [place], [address]   FLEXIBLE SIGMOIDOSCOPY PROCEDURE REPORT  PATIENT: Leshay, Doorn  MR#: [id number] BIRTHDATE: [birth year] , 35  yrs. old GENDER: Female ENDOSCOPIST: Beverley Fiedler, MD REFERRED BY: Joselyn Arrow PROCEDURE DATE:  07/26/2012 PROCEDURE:   Sigmoidoscopy, diagnostic ASA CLASS:   Class I INDICATIONS:rectal bleeding. MEDICATIONS: MAC sedation, administered by CRNA and Propofol (Diprivan) 60 mg IV  DESCRIPTION OF PROCEDURE:   After the risks benefits and alternatives of the procedure were thoroughly explained, informed consent was obtained.  revealed no abnormalities of the rectum. The endoscope was introduced through the anus  and advanced to the transverse colon , limited by No adverse events experienced.   The quality of the prep was good .  The instrument was then slowly withdrawn as the mucosa was fully examined.     FINDINGS:      Normal appearing colonic mucosa from the distal transverse colon to rectum.  Retroflexed views revealed internal hemorrhoid.    The scope was then withdrawn from the patient and the procedure terminated.  COMPLICATIONS: There were no complications.  ENDOSCOPIC IMPRESSION: 1.   The colonic mucosa appeared normal in the distal transverse colon, descending colon, sigmoid colon, and rectum 2.   Small internal hemorrhoids  RECOMMENDATIONS: 1.  Can use hydrocortisone suppository for internal hemorrhoids.  25 mg PR BID x 7 days. 2.  See EGD report . eSigned:  Beverley Fiedler, MD 07/26/2012 3:51 PM   CC:Eve Lynelle Doctor, MD The Patient

## 2012-07-26 NOTE — Op Note (Signed)
[facility] [address] [place], [address]   ENDOSCOPY PROCEDURE REPORT  PATIENT: Katherine Macias, Katherine Macias  MR#: [id number] BIRTHDATE: 06/10/1976 , 35  yrs. old GENDER: Female ENDOSCOPIST: Beverley Fiedler, MD REFERRED BY:  Joselyn Arrow PROCEDURE DATE:  07/26/2012 PROCEDURE:  EGD w/ biopsy ASA CLASS:     Class I INDICATIONS:  epigastric pain.   nausea. MEDICATIONS: MAC sedation, administered by CRNA and propofol (Diprivan) 200mg  IV TOPICAL ANESTHETIC: Cetacaine Spray  DESCRIPTION OF PROCEDURE: After the risks benefits and alternatives of the procedure were thoroughly explained, informed consent was obtained.  The LB [id number]-H180 [id number] endoscope was introduced through the mouth and advanced to the second portion of the duodenum. Without limitations.  The instrument was slowly withdrawn as the mucosa was fully examined.   ESOPHAGUS: The mucosa of the esophagus appeared normal.  STOMACH: Mild erosive, striped gastritis (inflammation) was found on the lesser curvature of the gastric body.  Multiple biopsies were performed using cold forceps to exclude H. Pylori infection.  DUODENUM: The duodenal mucosa showed no abnormalities in the bulb and second portion of the duodenum.  Retroflexed views revealed no abnormalities.     The scope was then withdrawn from the patient and the procedure completed.  COMPLICATIONS: There were no complications. ENDOSCOPIC IMPRESSION: 1.   The mucosa of the esophagus appeared normal 2.   Erosive gastritis (inflammation) was found on the lesser curvature of the gastric body; multiple biopsies 3.   The duodenal mucosa showed no abnormalities in the bulb and second portion of the duodenum  RECOMMENDATIONS: 1.  Await pathology results 2.  Follow-up of helicobacter pylori status, treat if indicated 3.  Avoid NSAIDs 4.  Pantoprazole 40 mg daily, 30 minutes to one hour before breakfast. 5.  Office follow up in 6 weeks to ensure  improvement. eSigned:  Beverley Fiedler, MD 07/26/2012 3:45 PM  CC:Eve Lynelle Doctor, MD and The Patient

## 2012-07-26 NOTE — Progress Notes (Signed)
Pressure applied to lower abdomen to advance scope further with flexible sigmoidoscopy

## 2012-07-26 NOTE — Patient Instructions (Addendum)
Recommendations:  Avoid NSAIDS, Pantoprazole 40 mg daily, 30 minutes to one hour before breakfast, office followup in 6 weeks to ensure improvement.  Hydrocortisone suppository for internal hemorrhoids.  YOU HAD AN ENDOSCOPIC PROCEDURE TODAY AT THE [facility]: Refer to the procedure report that was given to you for any specific questions about what was found during the examination.  If the procedure report does not answer your questions, please call your gastroenterologist to clarify.  If you requested that your care partner not be given the details of your procedure findings, then the procedure report has been included in a sealed envelope for you to review at your convenience later.  YOU SHOULD EXPECT: Some feelings of bloating in the abdomen. Passage of more gas than usual.  Walking can help get rid of the air that was put into your GI tract during the procedure and reduce the bloating. If you had a lower endoscopy (such as a colonoscopy or flexible sigmoidoscopy) you may notice spotting of blood in your stool or on the toilet paper. If you underwent a bowel prep for your procedure, then you may not have a normal bowel movement for a few days.  DIET: Your first meal following the procedure should be a light meal and then it is ok to progress to your normal diet.  A half-sandwich or bowl of soup is an example of a good first meal.  Heavy or fried foods are harder to digest and may make you feel nauseous or bloated.  Likewise meals heavy in dairy and vegetables can cause extra gas to form and this can also increase the bloating.  Drink plenty of fluids but you should avoid alcoholic beverages for 24 hours.  ACTIVITY: Your care partner should take you home directly after the procedure.  You should plan to take it easy, moving slowly for the rest of the day.  You can resume normal activity the day after the procedure however you should NOT DRIVE or use heavy machinery for 24 hours (because of  the sedation medicines used during the test).    SYMPTOMS TO REPORT IMMEDIATELY: A gastroenterologist can be reached at any hour.  During normal business hours, 8:30 AM to 5:00 PM Monday through Friday, call [phone].  After hours and on weekends, please call the GI answering service at [phone] who will take a message and have the physician on call contact you.   Following lower endoscopy (colonoscopy or flexible sigmoidoscopy):  Excessive amounts of blood in the stool  Significant tenderness or worsening of abdominal pains  Swelling of the abdomen that is new, acute  Fever of 100F or higher  Following upper endoscopy (EGD)  Vomiting of blood or coffee ground material  New chest pain or pain under the shoulder blades  Painful or persistently difficult swallowing  New shortness of breath  Fever of 100F or higher  Black, tarry-looking stools  FOLLOW UP: If any biopsies were taken you will be contacted by phone or by letter within the next 1-3 weeks.  Call your gastroenterologist if you have not heard about the biopsies in 3 weeks.  Our staff will call the home number listed on your records the next business day following your procedure to check on you and address any questions or concerns that you may have at that time regarding the information given to you following your procedure. This is a courtesy call and so if there is no answer at the home number and we have  not heard from you through the emergency physician on call, we will assume that you have returned to your regular daily activities without incident.  SIGNATURES/CONFIDENTIALITY: You and/or your care partner have signed paperwork which will be entered into your electronic medical record.  These signatures attest to the fact that that the information above on your After Visit Summary has been reviewed and is understood.  Full responsibility of the confidentiality of this discharge information lies with you and/or your  care-partner.   Please follow all discharge instructions given to you by the recovery room nurse. If you have any questions or problems after discharge please call one of the numbers listed above. You will receive a phone call in the am to see how you are doing and answer any questions you may have. Thank you for choosing [facility] for your health care needs.

## 2012-07-27 ENCOUNTER — Telehealth: Payer: Self-pay

## 2012-07-27 NOTE — Telephone Encounter (Signed)
Left a message on the pt's answering machine to call us if she has any questions or concerns. Maw

## 2012-08-02 ENCOUNTER — Encounter: Payer: Self-pay | Admitting: Family Medicine

## 2012-08-02 ENCOUNTER — Encounter: Payer: Self-pay | Admitting: Internal Medicine

## 2012-08-02 DIAGNOSIS — K648 Other hemorrhoids: Secondary | ICD-10-CM | POA: Insufficient documentation

## 2012-08-02 DIAGNOSIS — K2960 Other gastritis without bleeding: Secondary | ICD-10-CM | POA: Insufficient documentation

## 2012-08-06 ENCOUNTER — Other Ambulatory Visit: Payer: Self-pay | Admitting: *Deleted

## 2012-09-09 ENCOUNTER — Encounter: Payer: Self-pay | Admitting: Internal Medicine

## 2012-09-10 ENCOUNTER — Ambulatory Visit (INDEPENDENT_AMBULATORY_CARE_PROVIDER_SITE_OTHER): Payer: 59 | Admitting: Internal Medicine

## 2012-09-10 ENCOUNTER — Encounter: Payer: Self-pay | Admitting: Internal Medicine

## 2012-09-10 VITALS — BP 90/66 | HR 76 | Ht 63.0 in | Wt 107.5 lb

## 2012-09-10 DIAGNOSIS — K2960 Other gastritis without bleeding: Secondary | ICD-10-CM

## 2012-09-10 DIAGNOSIS — K2970 Gastritis, unspecified, without bleeding: Secondary | ICD-10-CM

## 2012-09-10 DIAGNOSIS — K2990 Gastroduodenitis, unspecified, without bleeding: Secondary | ICD-10-CM

## 2012-09-10 DIAGNOSIS — T39395A Adverse effect of other nonsteroidal anti-inflammatory drugs [NSAID], initial encounter: Secondary | ICD-10-CM

## 2012-09-10 DIAGNOSIS — K648 Other hemorrhoids: Secondary | ICD-10-CM

## 2012-09-10 MED ORDER — TRAMADOL HCL 50 MG PO TABS: 50.0000 mg | ORAL_TABLET | Freq: Four times a day (QID) | ORAL | Status: DC | PRN

## 2012-09-10 NOTE — Patient Instructions (Addendum)
We have sent the following medications to your pharmacy for you to pick up at your convenience: Tramadol, please take as directed.   If you need to take an NSAID, please take it with a PPI (Nexium)   Follow up with Dr. Rhea Belton as needed

## 2012-09-10 NOTE — Progress Notes (Signed)
  Subjective:    Patient ID: Katherine Macias, female    DOB: 20-Apr-1976, 36 y.o.   MRN: [id number]  HPI Katherine Macias is a 36 year old female seen in followup after recent endoscopy which revealed NSAID-induced gastritis.  She is alone today. She has been taking Nexium 40 mg daily and with this has had resolution of her epigastric abdominal pain. She's had no further nausea or vomiting. She's been able to eat what she desires without problems. No heartburn, dysphagia or odynophagia. Bowel habits have been normal and regular. No further rectal bleeding. She does frequently use NSAIDs during her menstrual period, and this is often for 3-5 days consecutively.  She wonders if she'll be able to use NSAIDs going forward for her menstrual cramping.    EGD was performed on 07/26/2012 revealed erosive gastritis on the lesser curvature of the stomach. Biopsies were negative for H. pylori. Review of Systems As per history of present illness, otherwise negative  Current Medications, Allergies, Past Medical History, Past Surgical History, Family History and Social History were reviewed in [employer] record.     Objective:   Physical Exam BP 90/66  Pulse 76  Ht 5\' 3"  (1.6 m)  Wt 107 lb 8 oz (48.762 kg)  BMI 19.04 kg/m2  LMP 08/20/2012 Constitutional: Well-developed and well-nourished. No distress. HEENT: Normocephalic and atraumatic. Oropharynx is clear and moist. No oropharyngeal exudate. Conjunctivae are normal.  No scleral icterus. Cardiovascular: Normal rate, regular rhythm and intact distal pulses. No M/R/G Pulmonary/chest: Effort normal and breath sounds normal. No wheezing, rales or rhonchi. Abdominal: Soft, nontender, nondistended. Bowel sounds active throughout. There are no masses palpable. No hepatosplenomegaly. Extremities: no clubbing, cyanosis, or edema Neurological: Alert and oriented to person place and time. Psychiatric: Normal mood and affect. Behavior is normal.    Assessment & Plan:  36 year old female seen in followup after recent endoscopy which revealed NSAID-induced gastritis.  1.  NSAID-related gastritis -- we have discussed the patient's gastritis today and if she continues to use NSAIDs for menstrual cramping, then I recommend she also used PPI therapy during these times. She likely would not require PPI on a regular basis, assuming she is not needing NSAIDs regularly. We will try a prescription for tramadol 50 mg every 6 hours as needed for pain. She may find that this will be sufficient to help with her menstrual cramping, and if not she will continue to use naproxen but used PPI during these periods. She will call back of her symptoms return for followup appointment.  2. Rectal bleeding  -- flex will sigmoidoscopy revealed internal hemorrhoids and this is felt to be the source of her intermittent bright red blood per rectum. This has not been an issue of late. If this recurs she can use hydrocortisone suppositories twice daily for 5 days. If the bleeding becomes more frequent she is asked to notify us.

## 2012-11-18 ENCOUNTER — Encounter: Payer: Self-pay | Admitting: Family Medicine

## 2012-11-18 ENCOUNTER — Ambulatory Visit (INDEPENDENT_AMBULATORY_CARE_PROVIDER_SITE_OTHER): Payer: 59 | Admitting: Family Medicine

## 2012-11-18 VITALS — BP 100/62 | HR 68 | Ht 63.0 in | Wt 106.0 lb

## 2012-11-18 DIAGNOSIS — K602 Anal fissure, unspecified: Secondary | ICD-10-CM

## 2012-11-18 MED ORDER — LIDOCAINE 5 % EX OINT: TOPICAL_OINTMENT | CUTANEOUS | Status: DC | PRN

## 2012-11-18 NOTE — Patient Instructions (Signed)
Anal Fissure, Adult An anal fissure is a small tear or crack in the skin around the anus. Bleeding from a fissure usually stops on its own within a few minutes. However, bleeding will often reoccur with each bowel movement until the crack heals.  CAUSES   Passing large, hard stools.  Frequent diarrheal stools.  Constipation.  Inflammatory bowel disease (Crohn's disease or ulcerative colitis).  Infections.  Anal sex. SYMPTOMS   Small amounts of blood seen on your stools, on toilet paper, or in the toilet after a bowel movement.  Rectal bleeding.  Painful bowel movements.  Itching or irritation around the anus. DIAGNOSIS Your caregiver will examine the anal area. An anal fissure can usually be seen with careful inspection. A rectal exam may be performed and a short tube (anoscope) may be used to examine the anal canal. TREATMENT   You may be instructed to take fiber supplements. These supplements can soften your stool to help make bowel movements easier.  Sitz baths may be recommended to help heal the tear. Do not use soap in the sitz baths.  A medicated cream or ointment may be prescribed to lessen discomfort. HOME CARE INSTRUCTIONS   Maintain a diet high in fruits, whole grains, and vegetables. Avoid constipating foods like bananas and dairy products.  Take sitz baths as directed by your caregiver.  Drink enough fluids to keep your urine clear or pale yellow.  Only take over-the-counter or prescription medicines for pain, discomfort, or fever as directed by your caregiver. Do not take aspirin as this may increase bleeding.  Do not use ointments containing numbing medications (anesthetics) or hydrocortisone. They could slow healing. SEEK MEDICAL CARE IF:   Your fissure is not completely healed within 3 days.  You have further bleeding.  You have a fever.  You have diarrhea mixed with blood.  You have pain.  Your problem is getting worse rather than  better. MAKE SURE YOU:   Understand these instructions.  Will watch your condition.  Will get help right away if you are not doing well or get worse. Document Released: 10/20/2005 Document Revised: 01/12/2012 Document Reviewed: 04/06/2011 [facility] Patient Information 2013 [place], [place].  Sitz Bath A sitz bath is a warm water bath taken in the sitting position that covers only the hips and buttocks. It may be used for either healing or hygiene purposes. Sitz baths are also used to relieve pain, itching, or muscle spasms. The water may contain medicine. Moist heat will help you heal and relax.  HOME CARE INSTRUCTIONS   Fill the bathtub half full with warm water.  Sit in the water and open the drain a little.  Turn on the warm water to keep the tub half full. Keep the water running constantly.  Soak in the water for 15 to 20 minutes.  After the sitz bath, pat the affected area dry first.  Take 3 to 4 sitz baths a day. SEEK MEDICAL CARE IF:  You get worse instead of better. Stop the sitz baths if you get worse. MAKE SURE YOU:  Understand these instructions.  Will watch your condition.  Will get help right away if you are not doing well or get worse. Document Released: 07/12/2004 Document Revised: 01/12/2012 Document Reviewed: 01/17/2011 [facility] Patient Information 2013 [place], [place].

## 2012-11-18 NOTE — Progress Notes (Signed)
Chief Complaint  Patient presents with  . Advice Only    thinks she may have either external hemorrhoids or fissure. Painful all the time but extreme with bowel movement. Dr.Pyrtle diagnosed her with internal hemorrhoids 2 months ago.   HPI:  6 days ago started with mild pain with a bowel movement, slight blood on toilet paper noted. She also had symptoms of yeast infection, was out of town, and went to [facility] where she was treated with diflucan.  Vaginal discharge and itching resolved within 2 days, but had increasing rectal discomfort.  4 days ago pain was much worse with bowel movements, and has some ongoing soreness.  Denies any swelling/external hemorrhoids.  She tried looking with a mirror, and thought she saw some ulcerations that were sore.  Has had fissures in the past, but this doesn't feel the same.  No further bleeding.  Bowel movements have been normal, soft, no straining with BM's or heavy lifting.  Past Medical History  Diagnosis Date  . Seasonal allergies   . Genital warts 05/2009    treated with liquid nitrogen  . Internal hemorrhoid 07/2012    seen on flex sig  . Erosive gastritis 07/2012   Past Surgical History  Procedure Date  . Wisdom tooth extraction   . Tonsillectomy   . Cervical polypectomy 04/24/08    benign  . Esophagogastroduodenoscopy 07/2012    erosive gastritis  . Flexible sigmoidoscopy 07/2012    internal hemorrhoid   History   Social History  . Marital Status: Married    Spouse Name: N/A    Number of Children: 0  . Years of Education: N/A   Occupational History  . Pharmacist [employer]   Social History Main Topics  . Smoking status: Never Smoker   . Smokeless tobacco: Never Used  . Alcohol Use: Yes     Comment: 1-2 glasses, 1-2 times per week  . Drug Use: No  . Sexually Active: Yes    Birth Control/ Protection: Other-see comments     Comment: husband s/p vasectomy   Other Topics Concern  . Not on file   Social History Narrative   2  stepchildrenDaily caffeine     Current outpatient prescriptions:Calcium-Phosphorus-Vitamin D (CALCIUM GUMMIES PO), Take 1 each by mouth daily., Disp: , Rfl: ;  Multiple Vitamins-Calcium (ONE-A-DAY WOMENS FORMULA PO), Take 1 tablet by mouth daily.  , Disp: , Rfl: ;  cetirizine (ZYRTEC) 10 MG tablet, Take 10 mg by mouth daily., Disp: , Rfl: ;  zolpidem (AMBIEN) 10 MG tablet, 1/2 - 1 tablet at bedtime as needed for insomnia, Disp: 30 tablet, Rfl: 0  No Known Allergies  ROS:  No fevers, URI symptoms, diarrhea, or other complaints.  No vaginal discharge or urinary symptoms.  PHYSICAL EXAM: BP 100/62  Pulse 68  Ht 5\' 3"  (1.6 m)  Wt 106 lb (48.081 kg)  BMI 18.78 kg/m2  LMP 10/27/2012  Well developed, pleasant female in no distress  Small healing fissure posteriorly, and at the more distal aspect of it (external) there is a very small ulceration.  No erythematous base.  Very tender to touch.  No external hemorrhoids.  Touching this area reproduced her discomfort.  ASSESSMENT/PLAN:  1. Rectal fissure  lidocaine (XYLOCAINE) 5 % ointment   Discussed Sitz baths, stool softeners (to soften stool further to narrow the BM and decrease pain).  Lidocaine ointment prn for severe pain.  Known internal hemorrhoids, which don't seem to be a factor in her rectal complaint today

## 2013-06-01 ENCOUNTER — Encounter: Payer: Self-pay | Admitting: Family Medicine

## 2013-06-01 ENCOUNTER — Ambulatory Visit (INDEPENDENT_AMBULATORY_CARE_PROVIDER_SITE_OTHER): Payer: 59 | Admitting: Family Medicine

## 2013-06-01 VITALS — BP 112/72 | HR 72 | Temp 98.0°F | Ht 63.0 in | Wt 106.0 lb

## 2013-06-01 DIAGNOSIS — J309 Allergic rhinitis, unspecified: Secondary | ICD-10-CM

## 2013-06-01 DIAGNOSIS — R51 Headache: Secondary | ICD-10-CM

## 2013-06-01 MED ORDER — MOMETASONE FUROATE 50 MCG/ACT NA SUSP: 2.0000 | Freq: Every day | NASAL | Status: DC

## 2013-06-01 NOTE — Patient Instructions (Addendum)
Try the nasonex samples.  If you like it, and want a prescription, call us--I gave you savings card.  If you prefer to try generic flonase first, let us know, and if it isn't as effective as the nasonex, we can always switch back.  Drink plenty of fluids. Consider trying Mucinex (plain or D). Let me know if you develop discolored mucus or other signs of infection for prescription of antibiotics.  Return if you develop new symptoms--worsening headaches, vision changes, neurologic symptoms, or other concerns.  It appears that headaches at this time are most likely related to your sinuses.

## 2013-06-01 NOTE — Progress Notes (Signed)
Chief Complaint  Patient presents with  . Headache    has had headache, dull x 2-3 months. (thinks it is a sinus headache, zyrtec and decongestants did not help). Ears over the last 45 days are "popping" not all the time but very frequent.    Had similar headaches last summer. This seems more constant, more intense.  Headaches are still frontal.  She is having some ear plugging and popping.  Denies sore throat, throat clearing, cough. Denies any discolored mucus.  Doesn't feel like previous sinus infections.   Denies eye strain.  She typically wakes up with the headache or shortly after (while in shower).  Headaches are mild, 3-5/10 in intensity.  She avoids NSAIDs due to h/o gastritis.  Tylenol isn't as helpful as it used to be in past.  Decongestants were not helpful.  She is using her Zyrtec daily.  Hasn't tried sinus rinses or Mucinex.  Denies photo- or phono-phobia, associated nausea, vomiting or any other neurologic symptoms.   Past Medical History  Diagnosis Date  . Seasonal allergies   . Genital warts 05/2009    treated with liquid nitrogen  . Internal hemorrhoid 07/2012    seen on flex sig  . Erosive gastritis 07/2012   Past Surgical History  Procedure Laterality Date  . Wisdom tooth extraction    . Tonsillectomy    . Cervical polypectomy  04/24/08    benign  . Esophagogastroduodenoscopy  07/2012    erosive gastritis  . Flexible sigmoidoscopy  07/2012    internal hemorrhoid   History   Social History  . Marital Status: Married    Spouse Name: N/A    Number of Children: 0  . Years of Education: N/A   Occupational History  . 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 Smoking status: Never Smoker   . Smokeless tobacco: Never Used  . Alcohol Use: Yes     Comment: 1-2 glasses, 1-2 times per week  . Drug Use: No  . Sexually Active: Yes    Birth Control/ Protection: Other-see comments     Comment: husband s/p vasectomy   Other Topics Concern  . Not on file    Social History Narrative   2 stepchildren   Daily caffeine    Current Outpatient Prescriptions on File Prior to Visit  Medication Sig Dispense Refill  . Calcium-Phosphorus-Vitamin D (CALCIUM GUMMIES PO) Take 1 each by mouth daily.      . cetirizine (ZYRTEC) 10 MG tablet Take 10 mg by mouth daily.      . Multiple Vitamins-Calcium (ONE-A-DAY WOMENS FORMULA PO) Take 1 tablet by mouth daily.        Marland Kitchen zolpidem (AMBIEN) 10 MG tablet 1/2 - 1 tablet at bedtime as needed for insomnia  30 tablet  0   No current facility-administered medications on file prior to visit.   No Known Allergies  ROS:  Denies fevers, chills, nausea, vomiting, diarrhea, skin rash, urinary complaints, numbness, tingling, weakness.  Denies clenching/grinding teeth.  Denies jaw pain, TMJ, ear pain, hearing loss, vision changes, cough, shortness of breath, chest pain, or other concerns except as per HPI  PHYSICAL EXAM: BP 112/72  Pulse 72  Temp(Src) 98 F (36.7 C) (Oral)  Ht 5\' 3"  (1.6 m)  Wt 106 lb (48.081 kg)  BMI 18.78 kg/m2  LMP 05/24/2013 Well developed, pleasant female in no distress HEENT:  PERRL, EOMI, conjunctiva clear.  Fundi benign. Temporalis muscles and temporal arteries nontender Nasal mucosa mild-moderately  edematous, L>R, with clear mucus.  Sinuses nontender.  TM's and EAC's normal OP clear Neck: no lymphadenopathy, thyromegaly or mass Heart: regular rate and rhythm without murmur Lungs: clear bilaterally Neuro: alert and oriented.  Cranial nerves 2-12 intact. Normal strength, sensation, DTR's 2+ and symmetric, normal finger to nose, normal gait.  ASSESSMENT/PLAN:  Frontal headache  Allergic rhinitis, cause unspecified - Plan: mometasone (NASONEX) 50 MCG/ACT nasal spray  DDX of headaches reviewed in detail. Appears to likely be sinus-related, given ETD symptoms, physical exam, and otherwise normal history and physical exam.  Try sinus rinses, mucinex (+/- decongestant).  Continue  zyrtec Trial of nasonex--2 samples and rx savings card given.  Call for rx if effective vs trial of generic flonase if preferred.  F/u prn

## 2013-06-13 ENCOUNTER — Telehealth: Payer: Self-pay | Admitting: Family Medicine

## 2013-06-13 MED ORDER — FLUTICASONE PROPIONATE 50 MCG/ACT NA SUSP: 2.0000 | Freq: Every day | NASAL | Status: DC

## 2013-06-13 NOTE — Telephone Encounter (Signed)
Flonase called in

## 2013-11-07 ENCOUNTER — Encounter: Payer: Self-pay | Admitting: Family Medicine

## 2013-11-07 ENCOUNTER — Ambulatory Visit (INDEPENDENT_AMBULATORY_CARE_PROVIDER_SITE_OTHER): Payer: 59 | Admitting: Family Medicine

## 2013-11-07 ENCOUNTER — Other Ambulatory Visit (HOSPITAL_COMMUNITY)
Admission: RE | Admit: 2013-11-07 | Discharge: 2013-11-07 | Disposition: A | Discharge status: Home / Self Care | Payer: 59 | Source: Ambulatory Visit | Attending: Family Medicine | Admitting: Family Medicine

## 2013-11-07 VITALS — BP 112/76 | HR 84 | Ht 63.0 in | Wt 104.0 lb

## 2013-11-07 DIAGNOSIS — Z01419 Encounter for gynecological examination (general) (routine) without abnormal findings: Secondary | ICD-10-CM | POA: Insufficient documentation

## 2013-11-07 DIAGNOSIS — R636 Underweight: Secondary | ICD-10-CM

## 2013-11-07 DIAGNOSIS — G4700 Insomnia, unspecified: Secondary | ICD-10-CM

## 2013-11-07 DIAGNOSIS — Z79899 Other long term (current) drug therapy: Secondary | ICD-10-CM

## 2013-11-07 DIAGNOSIS — Z1151 Encounter for screening for human papillomavirus (HPV): Secondary | ICD-10-CM | POA: Insufficient documentation

## 2013-11-07 DIAGNOSIS — K2960 Other gastritis without bleeding: Secondary | ICD-10-CM

## 2013-11-07 DIAGNOSIS — Z0000 Encounter for general adult medical examination without abnormal findings: Secondary | ICD-10-CM

## 2013-11-07 DIAGNOSIS — J309 Allergic rhinitis, unspecified: Secondary | ICD-10-CM

## 2013-11-07 DIAGNOSIS — K219 Gastro-esophageal reflux disease without esophagitis: Secondary | ICD-10-CM

## 2013-11-07 LAB — CBC WITH DIFFERENTIAL/PLATELET
BASOS ABS: 0 10*3/uL (ref 0.0–0.1)
BASOS PCT: 1 % (ref 0–1)
EOS ABS: 0.1 10*3/uL (ref 0.0–0.7)
Eosinophils Relative: 2 % (ref 0–5)
HCT: 44.5 % (ref 36.0–46.0)
Hemoglobin: 14.6 g/dL (ref 12.0–15.0)
LYMPHS ABS: 1.2 10*3/uL (ref 0.7–4.0)
Lymphocytes Relative: 19 % (ref 12–46)
MCH: 29.9 pg (ref 26.0–34.0)
MCHC: 32.8 g/dL (ref 30.0–36.0)
MCV: 91.2 fL (ref 78.0–100.0)
Monocytes Absolute: 0.4 10*3/uL (ref 0.1–1.0)
Monocytes Relative: 6 % (ref 3–12)
NEUTROS PCT: 72 % (ref 43–77)
Neutro Abs: 4.4 10*3/uL (ref 1.7–7.7)
PLATELETS: 263 10*3/uL (ref 150–400)
RBC: 4.88 MIL/uL (ref 3.87–5.11)
RDW: 13.3 % (ref 11.5–15.5)
WBC: 6.1 10*3/uL (ref 4.0–10.5)

## 2013-11-07 LAB — COMPREHENSIVE METABOLIC PANEL
ALBUMIN: 4.4 g/dL (ref 3.5–5.2)
ALK PHOS: 51 U/L (ref 39–117)
ALT: 11 U/L (ref 0–35)
AST: 19 U/L (ref 0–37)
BUN: 11 mg/dL (ref 6–23)
CO2: 28 meq/L (ref 19–32)
Calcium: 9.4 mg/dL (ref 8.4–10.5)
Chloride: 102 mEq/L (ref 96–112)
Creat: 0.76 mg/dL (ref 0.50–1.10)
GLUCOSE: 84 mg/dL (ref 70–99)
POTASSIUM: 4.6 meq/L (ref 3.5–5.3)
SODIUM: 136 meq/L (ref 135–145)
TOTAL PROTEIN: 6.8 g/dL (ref 6.0–8.3)
Total Bilirubin: 0.7 mg/dL (ref 0.3–1.2)

## 2013-11-07 LAB — LIPID PANEL
Cholesterol: 156 mg/dL (ref 0–200)
HDL: 83 mg/dL (ref >39)
LDL Cholesterol: 60 mg/dL (ref 0–99)
Total CHOL/HDL Ratio: 1.9 Ratio
Triglycerides: 63 mg/dL (ref <150)
VLDL: 13 mg/dL (ref 0–40)

## 2013-11-07 LAB — POCT URINALYSIS DIPSTICK
Bilirubin, UA: NEGATIVE
Blood, UA: NEGATIVE
Glucose, UA: NEGATIVE
Ketones, UA: NEGATIVE
LEUKOCYTES UA: NEGATIVE
NITRITE UA: NEGATIVE
PH UA: 5
PROTEIN UA: NEGATIVE
Spec Grav, UA: 1.025
Urobilinogen, UA: NEGATIVE

## 2013-11-07 MED ORDER — ZOLPIDEM TARTRATE 10 MG PO TABS: ORAL_TABLET | ORAL | Status: DC

## 2013-11-07 MED ORDER — PANTOPRAZOLE SODIUM 40 MG PO TBEC: 40.0000 mg | DELAYED_RELEASE_TABLET | Freq: Every day | ORAL | Status: DC

## 2013-11-07 NOTE — Progress Notes (Signed)
Chief Complaint  Patient presents with  . Annual Exam    fasting annual exam, last pap 04/2011, unsure if she needs one today or not. Would like to switch from Nexium to Protonix as it will be free with UMR.    Katherine Macias is a 37 y.o. female who presents for a complete physical.  She has no specific problems, other than switching PPI for cost purposes.  She was put on Nexium after being diagnosed with erosive esophagitis on EGD.   Has recurrent epigastric pain if she stops taking the Nexium, usually recurs about a week after being off meds.  No dysphagia, denies any heartburn.  Immunization History  Administered Date(s) Administered  . Influenza Split 08/02/2013  . Td 04/03/2004  Tetanus was given through employee health at hospital (so unclear which type of tetanus was actually given, TD documented in [facility] records, but may not be accurate)  gets flu shots yearly through work  Last Pap smear: 04/2011 (no HPV testing) Last mammogram: 05/2012 (baseline at age 33) Last colonoscopy: flex sig 07/2012 Last DEXA: n/a  Dentist: every 6 months  Ophtho: >7 years  Exercise: 4 days/week (running and biking, occasional weights)  Lipids 12/07 were excellent (total 146, HDL 83, LDL 48, TG 75) . She is fasting for repeat labs today. Normal vitamin D 04/2009 (44)  Past Medical History  Diagnosis Date  . Seasonal allergies   . Genital warts 05/2009    treated with liquid nitrogen  . Internal hemorrhoid 07/2012    seen on flex sig  . Erosive gastritis 07/2012    Past Surgical History  Procedure Laterality Date  . Wisdom tooth extraction    . Tonsillectomy    . Cervical polypectomy  04/24/08    benign  . Esophagogastroduodenoscopy  07/2012    erosive gastritis  . Flexible sigmoidoscopy  07/2012    internal hemorrhoid    History   Social History  . Marital Status: Married    Spouse Name: N/A    Number of Children: 0  . 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 Smoking status: Never Smoker   . Smokeless tobacco: Never Used  . Alcohol Use: Yes     Comment: 1-2 glasses, 1-2 times per week  . Drug Use: No  . Sexual Activity: Yes    Birth Control/ Protection: Other-see comments     Comment: husband s/p vasectomy   Other Topics Concern  . Not on file   Social History Narrative   2 stepchildren (lives part-time with them).  No pets   Daily caffeine     Family History  Problem Relation Age of Onset  . Hypertension Mother   . Breast cancer Mother 95  . Hypertension Father   . Breast cancer Paternal Aunt   . Diabetes Paternal Grandmother   . Breast cancer Paternal Aunt   . Breast cancer Cousin 33  . Colon cancer Neg Hx     Current outpatient prescriptions:Calcium-Phosphorus-Vitamin D (CALCIUM GUMMIES PO), Take 1 each by mouth daily., Disp: , Rfl: ;  cetirizine (ZYRTEC) 10 MG tablet, Take 10 mg by mouth daily., Disp: , Rfl: ;  fluticasone (FLONASE) 50 MCG/ACT nasal spray, Place 2 sprays into the nose daily., Disp: 16 g, Rfl: 11;  Multiple Vitamins-Calcium (ONE-A-DAY WOMENS FORMULA PO), Take 1 tablet by mouth daily.  , Disp: , Rfl:  pseudoephedrine (SUDAFED) 120 MG 12 hr tablet, Take  120 mg by mouth daily., Disp: , Rfl: ;  zolpidem (AMBIEN) 10 MG tablet, 1/2 - 1 tablet at bedtime as needed for insomnia, Disp: 30 tablet, Rfl: 0;  pantoprazole (PROTONIX) 40 MG tablet, Take 1 tablet (40 mg total) by mouth daily., Disp: 90 tablet, Rfl: 3  No Known Allergies  ROS: The patient denies anorexia, fever, weight changes, vision changes, decreased hearing, ear pain, sore throat, breast concerns, chest pain, palpitations, dizziness, syncope, dyspnea on exertion, cough, swelling, vomiting, diarrhea, constipation, melena, hematochezia, hematuria, incontinence, dysuria, irregular menstrual cycles, vaginal discharge, odor or itch, genital lesions, joint pains, numbness, tingling, weakness, tremor, suspicious skin lesions,  depression, abnormal bleeding/bruising, or enlarged lymph nodes.  Allergies--controlled by zyrtec and Flonase, but needing sudafed daily.  Headaches are much improved since doing this regimen, but still occasionally gets sinus headaches Intermittent trouble sleeping, controlled by 1/2 [place] about once a week.  PHYSICAL EXAM: BP 112/76  Pulse 84  Ht 5\' 3"  (1.6 m)  Wt 104 lb (47.174 kg)  BMI 18.43 kg/m2  LMP 10/20/2013  General Appearance:  Alert, cooperative, no distress, appears stated age   Head:  Normocephalic, without obvious abnormality, atraumatic   Eyes:  PERRL, conjunctiva/corneas clear, EOM's intact, fundi  benign   Ears:  Normal TM's and external ear canals   Nose:  Nares normal, no drainage or sinus tenderness. Nasal mucosa is pale, moderately edematous with some recent bleeding on left  Throat:  Lips, mucosa, and tongue normal; teeth and gums normal   Neck:  Supple; thyroid: no enlargement/tenderness/nodules; no carotid bruit or JVD.  Some anterior and posterior shotty lymphadenopathy on left, nontender  Back:  Spine nontender, no curvature, ROM normal, no CVA tenderness   Lungs:  Clear to auscultation bilaterally without wheezes, rales or ronchi; respirations unlabored   Chest Wall:  No tenderness or deformity   Heart:  Regular rate and rhythm, S1 and S2 normal, no murmur, rub  or gallop   Breast Exam:  No tenderness, masses, or nipple discharge or inversion. No axillary lymphadenopathy   Abdomen:  Soft, non-tender, nondistended, normoactive bowel sounds,  no masses, no hepatosplenomegaly   Genitalia:  Normal external genitalia without lesions. BUS and vagina normal; no cervical lesions or cervical motion tenderness. No abnormal vaginal discharge. Uterus and adnexa not enlarged, nontender, no masses. Pap performed   Rectal:  Not performed due to age<40 and no related complaints   Extremities:  No clubbing, cyanosis or edema   Pulses:  2+ and symmetric all extremities    Skin:  Skin color, texture, turgor normal, no rashes or lesions   Lymph nodes:  Cervical, supraclavicular, and axillary nodes normal   Neurologic:  CNII-XII intact, normal strength, sensation and gait; reflexes 2+ and symmetric throughout          Psych: Normal mood, affect, hygiene and grooming.   ASSESSMENT/PLAN:  Routine general medical examination at a health care facility - Plan: POCT Urinalysis Dipstick, Visual acuity screening, Cytology - PAP [place], Lipid panel, Comprehensive metabolic panel, CBC with Differential, TSH  Insomnia - intermittent - Plan: zolpidem (AMBIEN) 10 MG tablet  GERD (gastroesophageal reflux disease) - Plan: pantoprazole (PROTONIX) 40 MG tablet  Erosive gastritis - Plan: pantoprazole (PROTONIX) 40 MG tablet  Allergic rhinitis, cause unspecified - controlled with current regimen of antihistamine, decongestant and nasal steroid--continue  Encounter for long-term (current) use of other medications - Plan: Comprehensive metabolic panel, CBC with Differential  Underweight - ensure adequate caloric intake, especially when exercising. weight  stable overall--encouraged not to lose more - Plan: TSH   Discussed monthly self breast exams and yearly mammograms after the age of 25; at least 30 minutes of aerobic activity at least 5 days/week; proper sunscreen use reviewed; healthy diet, including goals of calcium and vitamin D intake and alcohol recommendations (less than or equal to 1 drink/day) reviewed; regular seatbelt use; changing batteries in smoke detectors.  Immunization recommendations discussed--check employee health for date/type of tetanus.  Return if LAD persists (husband is sick, suspect oncoming URI). GERD/esophagitis--I didn't think longterm PPI was going to be needed. Since she has recurrent symptoms when off meds, likely will need it longer term. Will try and see if any foods trigger symptoms.  Continue to avoid NSAIDs. Risks of longterm PPI  reviewed, recommend calcium, Mg supplement.

## 2013-11-07 NOTE — Patient Instructions (Signed)
  HEALTH MAINTENANCE RECOMMENDATIONS:  It is recommended that you get at least 30 minutes of aerobic exercise at least 5 days/week (for weight loss, you may need as much as 60-90 minutes). This can be any activity that gets your heart rate up. This can be divided in 10-15 minute intervals if needed, but try and build up your endurance at least once a week.  Weight bearing exercise is also recommended twice weekly.  Eat a healthy diet with lots of vegetables, fruits and fiber.  "Colorful" foods have a lot of vitamins (ie green vegetables, tomatoes, red peppers, etc).  Limit sweet tea, regular sodas and alcoholic beverages, all of which has a lot of calories and sugar.  Up to 1 alcoholic drink daily may be beneficial for women (unless trying to lose weight, watch sugars).  Drink a lot of water.  Calcium recommendations are 1200-1500 mg daily (1500 mg for postmenopausal women or women without ovaries), and vitamin D 1000 IU daily.  This should be obtained from diet and/or supplements (vitamins), and calcium should not be taken all at once, but in divided doses.  Monthly self breast exams and yearly mammograms for women over the age of 240 is recommended.  Sunscreen of at least SPF 30 should be used on all sun-exposed parts of the skin when outside between the hours of 10 am and 4 pm (not just when at beach or pool, but even with exercise, golf, tennis, and yard work!)  Use a sunscreen that says "broad spectrum" so it covers both UVA and UVB rays, and make sure to reapply every 1-2 hours.  Remember to change the batteries in your smoke detectors when changing your clock times in the spring and fall.  Use your seat belt every time you are in a car, and please drive safely and not be distracted with cell phones and texting while driving.   See if you can find out the date and type of tetanus shot you received from Employee Health, and pass that info on to us (through MyChart or a phone call), so that we  can know when you need another vaccine.  Schedule a routine eye exam (if normal, you likely won't need again for another 5 years)

## 2013-11-08 ENCOUNTER — Encounter: Payer: Self-pay | Admitting: Family Medicine

## 2013-11-08 LAB — TSH: TSH: 1.046 u[IU]/mL (ref 0.350–4.500)

## 2013-11-10 ENCOUNTER — Encounter: Payer: Self-pay | Admitting: Family Medicine

## 2013-11-11 ENCOUNTER — Encounter: Payer: Self-pay | Admitting: Family Medicine

## 2013-11-14 ENCOUNTER — Encounter: Payer: Self-pay | Admitting: *Deleted

## 2013-11-14 ENCOUNTER — Other Ambulatory Visit: Payer: Self-pay | Admitting: *Deleted

## 2013-11-14 MED ORDER — FLUCONAZOLE 150 MG PO TABS: 150.0000 mg | ORAL_TABLET | Freq: Once | ORAL | Status: DC

## 2014-06-14 ENCOUNTER — Encounter: Payer: Self-pay | Admitting: Internal Medicine

## 2014-08-10 ENCOUNTER — Encounter: Payer: Self-pay | Admitting: Family Medicine

## 2014-08-10 ENCOUNTER — Ambulatory Visit (INDEPENDENT_AMBULATORY_CARE_PROVIDER_SITE_OTHER): Payer: 59 | Admitting: Family Medicine

## 2014-08-10 VITALS — BP 104/70 | HR 62 | Temp 97.9°F | Ht 63.5 in | Wt 108.0 lb

## 2014-08-10 DIAGNOSIS — J069 Acute upper respiratory infection, unspecified: Secondary | ICD-10-CM

## 2014-08-10 DIAGNOSIS — J0110 Acute frontal sinusitis, unspecified: Secondary | ICD-10-CM

## 2014-08-10 DIAGNOSIS — J302 Other seasonal allergic rhinitis: Secondary | ICD-10-CM

## 2014-08-10 MED ORDER — AMOXICILLIN 500 MG PO TABS: 1000.0000 mg | ORAL_TABLET | Freq: Two times a day (BID) | ORAL | Status: DC

## 2014-08-10 NOTE — Progress Notes (Signed)
Chief Complaint  Patient presents with  . Sinus Problem    X 1 WEEK WITH ALOT OF DRAINAGE STARTED TO SETTLE IN CHEST SHE IS VERY TIRED    She has been seeing an allergist, and is on immunotherapy (for the last 2-3 months).  Allergies had been under fair control (with daily claritin, and flonase--although not using flonase every single day, and using just 1 spray/nostril). Over the last week she has developed more congestion, post nasal drainage. Mucus was clear initially, then changed to yellow-green.  She is now 6 days into the illness. Discolored mucus was first noted 3 days ago, and remains discolored. No fevers, chills.  She has some pressure in her forehead, and some heaviness in her chest. Denies significant cough, just in the morning (PND).  No shortness of breath.  She is using sinus rinses once daily--not getting much up.  Husband had a recent cold, which resolved.  Recently in [place]. Had GI symptoms for just 1 day.  Someone on her trip had more significant GI illness, and upon return to the states was found to have Giardia and tapeworm. Her bowels have completely returned to normal, but is concerned about worms.  Past Medical History  Diagnosis Date  . Seasonal allergies   . Genital warts 05/2009    treated with liquid nitrogen  . Internal hemorrhoid 07/2012    seen on flex sig  . Erosive gastritis 07/2012  . Allergy     tree/dust,cat,dog,feather,mold,grass,weed   Past Surgical History  Procedure Laterality Date  . Wisdom tooth extraction    . Tonsillectomy    . Cervical polypectomy  04/24/08    benign  . Esophagogastroduodenoscopy  07/2012    erosive gastritis  . Flexible sigmoidoscopy  07/2012    internal hemorrhoid   History   Social History  . Marital Status: Married    Spouse Name: N/A    Number of Children: 0  . 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 Smoking status: Never Smoker   . Smokeless  tobacco: Never Used  . Alcohol Use: Yes     Comment: 1-2 glasses, 1-2 times per week  . Drug Use: No  . Sexual Activity: Yes    Birth Control/ Protection: Other-see comments     Comment: husband s/p vasectomy   Other Topics Concern  . Not on file   Social History Narrative   2 stepchildren (lives part-time with them).  No pets   Daily caffeine    Outpatient Encounter Prescriptions as of 08/10/2014  Medication Sig  . Calcium-Phosphorus-Vitamin D (CALCIUM GUMMIES PO) Take 1 each by mouth daily.  . cetirizine (ZYRTEC) 10 MG tablet Take 10 mg by mouth daily.  Marland Kitchen ibuprofen (ADVIL,MOTRIN) 600 MG tablet Take 600 mg by mouth every 6 (six) hours as needed.  . Multiple Vitamins-Calcium (ONE-A-DAY WOMENS FORMULA PO) Take 1 tablet by mouth daily.    . pantoprazole (PROTONIX) 40 MG tablet Take 1 tablet (40 mg total) by mouth daily.  . Pseudoeph-CPM-DM-APAP (TYLENOL COLD PLUS COUGH CHILD PO) Take by mouth.  . pseudoephedrine (SUDAFED) 120 MG 12 hr tablet Take 120 mg by mouth daily.  Marland Kitchen zolpidem (AMBIEN) 10 MG tablet 1/2 - 1 tablet at bedtime as needed for insomnia  . amoxicillin (AMOXIL) 500 MG tablet Take 2 tablets (1,000 mg total) by mouth 2 (two) times daily.  . fluticasone (FLONASE) 50 MCG/ACT nasal spray Place 2 sprays  into the nose daily.  . [DISCONTINUED] fluconazole (DIFLUCAN) 150 MG tablet Take 1 tablet (150 mg total) by mouth once.  (amoxil rx'd today)  No Known Allergies  ROS:  No fevers, chills, dizziness, ear pain, chest pain, shortness of breath, nausea, vomiting, diarrhea, rashes, bleeding, bruising or other concerns.  See HPI  PHYSICAL EXAM: BP 104/70  Pulse 62  Temp(Src) 97.9 F (36.6 C) (Oral)  Ht 5' 3.5" (1.613 m)  Wt 108 lb (48.988 kg)  BMI 18.83 kg/m2  Well developed, pleasant female in no distress HEENT:  PERRL, ,EOMI, conjunctiva clear. Nasal mucosa is moderately edematous, no erythema, clear mucus. No purulence Mildly tender over bilateral frontal sinuses. OP is  clear without erythema Neck: no lymphadenopathy, thyromegaly or mass Heart: regular rate and rhythm Lungs: clear bilaterally Extremities: no edema Skin: no rashes Psych: normal mood, affect, hygiene and grooming  ASSESSMENT/PLAN:  Acute upper respiratory infection  Other seasonal allergic rhinitis  Acute frontal sinusitis, recurrence not specified - Plan: amoxicillin (AMOXIL) 500 MG tablet  Likely acute URI (from husband) on top of her routine allergies.  A little early to call bacterial sinusitis.  Try further supportive measures, and start ABX if symptoms persist/worsen in the next few days.   Drink plenty of fluid. Use Flonase every day (increase to 2 sprays each nostril) Continue sinus rinses. Add decongestant and Guaifenesin (expectorant)--ie Mucinex D, vs plain Mucinex and a separate decongestant. Start the antibiotic if your sinus pressure and discolored mucus doesn't improve over the next 1-2 days.  Possible exposure to tainted water on vacation. Her GI symptoms have completely resolved. Declines O&P testing.  Will call for specimen collection kit if bowels change or she changes her mind.

## 2014-08-10 NOTE — Patient Instructions (Signed)
  Drink plenty of fluid. Use Flonase every day (increase to 2 sprays each nostril) Continue sinus rinses. Add decongestant and Guaifenesin (expectorant)--ie Mucinex D, vs plain Mucinex and a separate decongestant. Start the antibiotic if your sinus pressure and discolored mucus doesn't improve over the next 1-2 days.  Call us if you change your mind re: ova and parasite stool testing (definitely if you develop changes in your bowels/diarrhea).

## 2014-08-18 ENCOUNTER — Other Ambulatory Visit: Payer: Self-pay

## 2014-08-21 ENCOUNTER — Other Ambulatory Visit: Payer: Self-pay | Admitting: Family Medicine

## 2014-08-21 NOTE — Telephone Encounter (Signed)
I THINK THIS IS DR.KNAPPS PT

## 2014-10-30 ENCOUNTER — Encounter: Payer: Self-pay | Admitting: Family Medicine

## 2014-10-30 NOTE — Telephone Encounter (Signed)
I wrote out prescription to be given to patient along with specimen collection containers.  I let her know she can come and pick them up

## 2014-11-08 ENCOUNTER — Telehealth: Payer: Self-pay | Admitting: *Deleted

## 2014-11-08 NOTE — Telephone Encounter (Signed)
Called patient to let her know that stool studies were negative.

## 2014-11-13 ENCOUNTER — Encounter: Payer: Self-pay | Admitting: Family Medicine

## 2014-11-15 ENCOUNTER — Encounter: Payer: Self-pay | Admitting: Family Medicine

## 2014-11-15 DIAGNOSIS — K2960 Other gastritis without bleeding: Secondary | ICD-10-CM

## 2014-11-15 DIAGNOSIS — K219 Gastro-esophageal reflux disease without esophagitis: Secondary | ICD-10-CM

## 2014-11-15 MED ORDER — PANTOPRAZOLE SODIUM 40 MG PO TBEC: 40.0000 mg | DELAYED_RELEASE_TABLET | Freq: Every day | ORAL | Status: DC

## 2015-02-13 ENCOUNTER — Encounter: Payer: Self-pay | Admitting: Family Medicine

## 2015-02-13 DIAGNOSIS — K219 Gastro-esophageal reflux disease without esophagitis: Secondary | ICD-10-CM

## 2015-02-13 DIAGNOSIS — K2960 Other gastritis without bleeding: Secondary | ICD-10-CM

## 2015-02-13 MED ORDER — PANTOPRAZOLE SODIUM 40 MG PO TBEC: 40.0000 mg | DELAYED_RELEASE_TABLET | Freq: Every day | ORAL | Status: DC

## 2015-03-12 ENCOUNTER — Encounter: Payer: Self-pay | Admitting: Family Medicine

## 2015-03-12 ENCOUNTER — Ambulatory Visit (INDEPENDENT_AMBULATORY_CARE_PROVIDER_SITE_OTHER): Payer: 59 | Admitting: Family Medicine

## 2015-03-12 VITALS — BP 100/64 | HR 68 | Ht 64.5 in | Wt 107.8 lb

## 2015-03-12 DIAGNOSIS — K2900 Acute gastritis without bleeding: Secondary | ICD-10-CM | POA: Diagnosis not present

## 2015-03-12 DIAGNOSIS — G4700 Insomnia, unspecified: Secondary | ICD-10-CM

## 2015-03-12 DIAGNOSIS — J302 Other seasonal allergic rhinitis: Secondary | ICD-10-CM

## 2015-03-12 DIAGNOSIS — K2960 Other gastritis without bleeding: Secondary | ICD-10-CM

## 2015-03-12 DIAGNOSIS — K219 Gastro-esophageal reflux disease without esophagitis: Secondary | ICD-10-CM | POA: Diagnosis not present

## 2015-03-12 DIAGNOSIS — Z0000 Encounter for general adult medical examination without abnormal findings: Secondary | ICD-10-CM | POA: Diagnosis not present

## 2015-03-12 DIAGNOSIS — Z23 Encounter for immunization: Secondary | ICD-10-CM

## 2015-03-12 LAB — POCT URINALYSIS DIPSTICK
Bilirubin, UA: NEGATIVE
Blood, UA: NEGATIVE
Glucose, UA: NEGATIVE
KETONES UA: NEGATIVE
LEUKOCYTES UA: NEGATIVE
Nitrite, UA: NEGATIVE
PH UA: 6
PROTEIN UA: NEGATIVE
Spec Grav, UA: 1.025
UROBILINOGEN UA: NEGATIVE

## 2015-03-12 MED ORDER — ZOLPIDEM TARTRATE 10 MG PO TABS: ORAL_TABLET | ORAL | Status: DC

## 2015-03-12 MED ORDER — PANTOPRAZOLE SODIUM 40 MG PO TBEC: 40.0000 mg | DELAYED_RELEASE_TABLET | Freq: Every day | ORAL | Status: DC

## 2015-03-12 NOTE — Patient Instructions (Signed)

## 2015-03-12 NOTE — Progress Notes (Signed)
Chief Complaint  Patient presents with  . Annual Exam    fasting annual exam with pelvic(last pap 11/08/13). No concerns.    Katherine Macias is a 39 y.o. female who presents for a complete physical.  She has the following concerns:  H/o erosive gastritis, and has recurrent epigastric pain if she stops PPI.  She has been taking Protonix daily without any problems.  She has recurrent symptoms if she misses it for 2-3 days. Denies dysphagia, hearburn.  Insomnia:  Infrequent, and well treated with just 1/2 tablet of Ambien as needed.  She had rx of #30 since 11/2013 and still has about 10 left.  Uses if has a presentation the next day (knowing she won't sleep well), or other times, as needed.  Requesting refill.  Allergies:  Well treated with current regimen of zyrtec and Flonase.  She is also currently getting allergy shots since 06/2014 (Dr. Donneta Romberg).  She had an easier time this past pollen season since being on the shots. Fewer headaches, needing sudafed much less frequently than prior to allergy shots.  Immunization History  Administered Date(s) Administered  . Influenza Split 08/02/2013  . Td 04/03/2004  . Tdap 05/29/2005   gets flu shots yearly through work  Last Pap smear: 11/2013--candida; no high risk HPV Last mammogram: 05/2012 (baseline at age 36) Last colonoscopy: flex sig 07/2012 Last DEXA: n/a  Dentist: every 6 months  Ophtho: last summer Exercise: 4 days/week (running and biking, occasional weights)  Lipids 11/2013--normal; also had normal c-met, CBC, TSH Normal vitamin D 04/2009 (44)  Past Medical History  Diagnosis Date  . Seasonal allergies   . Genital warts 05/2009    treated with liquid nitrogen  . Internal hemorrhoid 07/2012    seen on flex sig  . Erosive gastritis 07/2012  . Allergy     tree/dust,cat,dog,feather,mold,grass,weed    Past Surgical History  Procedure Laterality Date  . Wisdom tooth extraction    . Tonsillectomy    . Cervical polypectomy  04/24/08   benign  . Esophagogastroduodenoscopy  07/2012    erosive gastritis  . Flexible sigmoidoscopy  07/2012    internal hemorrhoid    History   Social History  . Marital Status: Married    Spouse Name: N/A  . Number of Children: 0  . 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 Smoking status: Never Smoker   . Smokeless tobacco: Never Used  . Alcohol Use: Yes     Comment: 1-2 glasses, 1-2 times per week  . Drug Use: No  . Sexual Activity: Yes    Birth Control/ Protection: Other-see comments     Comment: husband s/p vasectomy   Other Topics Concern  . Not on file   Social History Narrative   Married. 2 stepchildren (lives part-time with them).  No pets   Daily caffeine 1-2/day    Family History  Problem Relation Age of Onset  . Hypertension Mother   . Breast cancer Mother 69  . Hypertension Father   . Breast cancer Paternal Aunt   . Diabetes Paternal Grandmother   . Breast cancer Paternal Aunt   . Breast cancer Cousin 44    BRCA+  . Colon cancer Neg Hx    Outpatient Encounter Prescriptions as of 03/12/2015  Medication Sig  . Calcium-Phosphorus-Vitamin D (CALCIUM GUMMIES PO) Take 1 each by mouth daily.  . cetirizine (ZYRTEC) 10 MG tablet Take 10 mg  by mouth daily.  . fluticasone (FLONASE) 50 MCG/ACT nasal spray PLACE 2 SPRAYS INTO THE NOSE DAILY.  . Multiple Vitamins-Calcium (ONE-A-DAY WOMENS FORMULA PO) Take 1 tablet by mouth daily.    . pantoprazole (PROTONIX) 40 MG tablet Take 1 tablet (40 mg total) by mouth daily.  Marland Kitchen zolpidem (AMBIEN) 10 MG tablet 1/2 - 1 tablet at bedtime as needed for insomnia  . pseudoephedrine (SUDAFED) 120 MG 12 hr tablet Take 120 mg by mouth daily.  . [DISCONTINUED] amoxicillin (AMOXIL) 500 MG tablet Take 2 tablets (1,000 mg total) by mouth 2 (two) times daily.  . [DISCONTINUED] ibuprofen (ADVIL,MOTRIN) 600 MG tablet Take 600 mg by mouth every 6 (six) hours as needed.  .  [DISCONTINUED] Pseudoeph-CPM-DM-APAP (TYLENOL COLD PLUS COUGH CHILD PO) Take by mouth.   No facility-administered encounter medications on file as of 03/12/2015.   (using sudafed just prn, not currently)  No Known Allergies  ROS: The patient denies anorexia, fever, weight changes, vision changes, decreased hearing, ear pain, sore throat, breast concerns, chest pain, palpitations, dizziness, syncope, dyspnea on exertion, cough, swelling, vomiting, diarrhea, constipation, melena, hematochezia, hematuria, incontinence, dysuria, irregular menstrual cycles, vaginal discharge, odor or itch, genital lesions, joint pains, numbness, tingling, weakness, tremor, suspicious skin lesions, depression, abnormal bleeding/bruising, or enlarged lymph nodes.  Headaches are gone since being on allergy shots, uses sudafed much less often. Intermittent trouble sleeping, controlled by 1/2 [place] about once a week.  PHYSICAL EXAM:  BP 100/64 mmHg  Pulse 68  Ht 5' 4.5" (1.638 m)  Wt 107 lb 12.8 oz (48.898 kg)  BMI 18.22 kg/m2  LMP 02/26/2015  General Appearance:  Alert, cooperative, no distress, appears stated age   Head:  Normocephalic, without obvious abnormality, atraumatic   Eyes:  PERRL, conjunctiva/corneas clear, EOM's intact, fundi  benign   Ears:  Normal TM's and external ear canals   Nose:  Nares normal, no drainage or sinus tenderness. Nasal mucosa is pale, slightly edematous; no drainage or purulence  Throat:  Lips, mucosa, and tongue normal; teeth and gums normal   Neck:  Supple; thyroid: no enlargement/tenderness/nodules; no carotid bruit or JVD. no lymphadenopathy  Back:  Spine nontender, no curvature, ROM normal, no CVA tenderness   Lungs:  Clear to auscultation bilaterally without wheezes, rales or ronchi; respirations unlabored   Chest Wall:  No tenderness or deformity   Heart:  Regular rate and rhythm, S1 and S2 normal, no murmur, rub  or gallop   Breast Exam:  No  tenderness, masses, or nipple discharge or inversion. No axillary lymphadenopathy   Abdomen:  Soft, non-tender, nondistended, normoactive bowel sounds,  no masses, no hepatosplenomegaly   Genitalia:  Normal external genitalia without lesions. BUS and vagina normal; no cervical motion tenderness. No abnormal vaginal discharge. Uterus and adnexa not enlarged, nontender, no masses. Pap not performed   Rectal:  Not performed due to age<40 and no related complaints   Extremities:  No clubbing, cyanosis or edema   Pulses:  2+ and symmetric all extremities   Skin:  Skin color, texture, turgor normal, no rashes or lesions   Lymph nodes:  Cervical, supraclavicular, and axillary nodes normal   Neurologic:  CNII-XII intact, normal strength, sensation and gait; reflexes 2+ and symmetric throughout    Psych:  Normal mood, affect, hygiene and grooming        ASSESSMENT/PLAN:  Annual physical exam - Plan: POCT Urinalysis Dipstick, Visual acuity screening  Insomnia - Plan: zolpidem (AMBIEN) 10 MG tablet  Gastroesophageal reflux disease,  esophagitis presence not specified - Plan: pantoprazole (PROTONIX) 40 MG tablet  Other seasonal allergic rhinitis  Erosive gastritis - Plan: pantoprazole (PROTONIX) 40 MG tablet  Insomnia - intermittent - Plan: zolpidem (AMBIEN) 10 MG tablet  Need for prophylactic vaccination with combined diphtheria-tetanus-pertussis (DTP) vaccine - Plan: Td : Tetanus/diphtheria >7yo Preservative  free   Discussed monthly self breast exams and yearly mammograms after the age of 27; at least 30 minutes of aerobic activity at least 5 days/week; proper sunscreen use reviewed; healthy diet, including goals of calcium and vitamin D intake and alcohol recommendations (less than or equal to 1 drink/day) reviewed; regular seatbelt use; changing batteries in smoke detectors. Immunization--discussed Td vs Tdap--unclear from guidelines if Tdap needs  to be repeated in 10 years vs just Td. Td given today.   Last lipids were excellent, can check q5 years.  Risks of longterm PPI vs untreated reflux were reviewed. Given ongoing need (symptoms if not taken), continue to take vitamins (containing Ca, Mg, B12, etc) to minimize risks of complications.  F/u 1 year, sooner pern.

## 2015-07-05 ENCOUNTER — Encounter: Payer: Self-pay | Admitting: Family Medicine

## 2015-11-07 DIAGNOSIS — J301 Allergic rhinitis due to pollen: Secondary | ICD-10-CM | POA: Diagnosis not present

## 2015-11-07 DIAGNOSIS — J3081 Allergic rhinitis due to animal (cat) (dog) hair and dander: Secondary | ICD-10-CM | POA: Diagnosis not present

## 2015-11-07 DIAGNOSIS — J3089 Other allergic rhinitis: Secondary | ICD-10-CM | POA: Diagnosis not present

## 2015-11-14 DIAGNOSIS — J3089 Other allergic rhinitis: Secondary | ICD-10-CM | POA: Diagnosis not present

## 2015-11-14 DIAGNOSIS — J301 Allergic rhinitis due to pollen: Secondary | ICD-10-CM | POA: Diagnosis not present

## 2015-11-14 DIAGNOSIS — J3081 Allergic rhinitis due to animal (cat) (dog) hair and dander: Secondary | ICD-10-CM | POA: Diagnosis not present

## 2015-11-19 DIAGNOSIS — J301 Allergic rhinitis due to pollen: Secondary | ICD-10-CM | POA: Diagnosis not present

## 2015-11-19 DIAGNOSIS — J3089 Other allergic rhinitis: Secondary | ICD-10-CM | POA: Diagnosis not present

## 2015-11-19 MED FILL — PANTOPRAZOLE SOD DR 40 MG T: 40 | 90 days supply | Qty: 90 | Fill #1

## 2015-12-11 DIAGNOSIS — J3081 Allergic rhinitis due to animal (cat) (dog) hair and dander: Secondary | ICD-10-CM | POA: Diagnosis not present

## 2015-12-11 DIAGNOSIS — J3089 Other allergic rhinitis: Secondary | ICD-10-CM | POA: Diagnosis not present

## 2015-12-11 DIAGNOSIS — J301 Allergic rhinitis due to pollen: Secondary | ICD-10-CM | POA: Diagnosis not present

## 2015-12-19 DIAGNOSIS — J301 Allergic rhinitis due to pollen: Secondary | ICD-10-CM | POA: Diagnosis not present

## 2015-12-19 DIAGNOSIS — J3089 Other allergic rhinitis: Secondary | ICD-10-CM | POA: Diagnosis not present

## 2015-12-19 DIAGNOSIS — J3081 Allergic rhinitis due to animal (cat) (dog) hair and dander: Secondary | ICD-10-CM | POA: Diagnosis not present

## 2015-12-24 DIAGNOSIS — J3089 Other allergic rhinitis: Secondary | ICD-10-CM | POA: Diagnosis not present

## 2015-12-24 DIAGNOSIS — J3081 Allergic rhinitis due to animal (cat) (dog) hair and dander: Secondary | ICD-10-CM | POA: Diagnosis not present

## 2015-12-24 DIAGNOSIS — J301 Allergic rhinitis due to pollen: Secondary | ICD-10-CM | POA: Diagnosis not present

## 2015-12-24 MED FILL — FLUTICASONE PROP 50 MCG SPR: 50 | 30 days supply | Qty: 16 | Fill #0

## 2015-12-31 DIAGNOSIS — J3081 Allergic rhinitis due to animal (cat) (dog) hair and dander: Secondary | ICD-10-CM | POA: Diagnosis not present

## 2015-12-31 DIAGNOSIS — J301 Allergic rhinitis due to pollen: Secondary | ICD-10-CM | POA: Diagnosis not present

## 2015-12-31 DIAGNOSIS — J3089 Other allergic rhinitis: Secondary | ICD-10-CM | POA: Diagnosis not present

## 2016-01-08 DIAGNOSIS — J3089 Other allergic rhinitis: Secondary | ICD-10-CM | POA: Diagnosis not present

## 2016-01-08 DIAGNOSIS — J3081 Allergic rhinitis due to animal (cat) (dog) hair and dander: Secondary | ICD-10-CM | POA: Diagnosis not present

## 2016-01-08 DIAGNOSIS — J301 Allergic rhinitis due to pollen: Secondary | ICD-10-CM | POA: Diagnosis not present

## 2016-01-16 DIAGNOSIS — J301 Allergic rhinitis due to pollen: Secondary | ICD-10-CM | POA: Diagnosis not present

## 2016-01-16 DIAGNOSIS — J3081 Allergic rhinitis due to animal (cat) (dog) hair and dander: Secondary | ICD-10-CM | POA: Diagnosis not present

## 2016-01-16 DIAGNOSIS — J3089 Other allergic rhinitis: Secondary | ICD-10-CM | POA: Diagnosis not present

## 2016-01-21 DIAGNOSIS — J3089 Other allergic rhinitis: Secondary | ICD-10-CM | POA: Diagnosis not present

## 2016-01-21 DIAGNOSIS — J301 Allergic rhinitis due to pollen: Secondary | ICD-10-CM | POA: Diagnosis not present

## 2016-01-21 DIAGNOSIS — J3081 Allergic rhinitis due to animal (cat) (dog) hair and dander: Secondary | ICD-10-CM | POA: Diagnosis not present

## 2016-01-31 DIAGNOSIS — J3089 Other allergic rhinitis: Secondary | ICD-10-CM | POA: Diagnosis not present

## 2016-01-31 DIAGNOSIS — J301 Allergic rhinitis due to pollen: Secondary | ICD-10-CM | POA: Diagnosis not present

## 2016-01-31 DIAGNOSIS — J3081 Allergic rhinitis due to animal (cat) (dog) hair and dander: Secondary | ICD-10-CM | POA: Diagnosis not present

## 2016-02-05 DIAGNOSIS — J3081 Allergic rhinitis due to animal (cat) (dog) hair and dander: Secondary | ICD-10-CM | POA: Diagnosis not present

## 2016-02-05 DIAGNOSIS — J301 Allergic rhinitis due to pollen: Secondary | ICD-10-CM | POA: Diagnosis not present

## 2016-02-05 DIAGNOSIS — J3089 Other allergic rhinitis: Secondary | ICD-10-CM | POA: Diagnosis not present

## 2016-02-13 DIAGNOSIS — J3081 Allergic rhinitis due to animal (cat) (dog) hair and dander: Secondary | ICD-10-CM | POA: Diagnosis not present

## 2016-02-13 DIAGNOSIS — J3089 Other allergic rhinitis: Secondary | ICD-10-CM | POA: Diagnosis not present

## 2016-02-13 DIAGNOSIS — J301 Allergic rhinitis due to pollen: Secondary | ICD-10-CM | POA: Diagnosis not present

## 2016-02-19 DIAGNOSIS — J3089 Other allergic rhinitis: Secondary | ICD-10-CM | POA: Diagnosis not present

## 2016-02-19 DIAGNOSIS — J3081 Allergic rhinitis due to animal (cat) (dog) hair and dander: Secondary | ICD-10-CM | POA: Diagnosis not present

## 2016-02-19 DIAGNOSIS — J301 Allergic rhinitis due to pollen: Secondary | ICD-10-CM | POA: Diagnosis not present

## 2016-02-29 DIAGNOSIS — J3089 Other allergic rhinitis: Secondary | ICD-10-CM | POA: Diagnosis not present

## 2016-02-29 DIAGNOSIS — J301 Allergic rhinitis due to pollen: Secondary | ICD-10-CM | POA: Diagnosis not present

## 2016-02-29 DIAGNOSIS — J3081 Allergic rhinitis due to animal (cat) (dog) hair and dander: Secondary | ICD-10-CM | POA: Diagnosis not present

## 2016-03-03 DIAGNOSIS — J301 Allergic rhinitis due to pollen: Secondary | ICD-10-CM | POA: Diagnosis not present

## 2016-03-03 DIAGNOSIS — J3081 Allergic rhinitis due to animal (cat) (dog) hair and dander: Secondary | ICD-10-CM | POA: Diagnosis not present

## 2016-03-03 DIAGNOSIS — J3089 Other allergic rhinitis: Secondary | ICD-10-CM | POA: Diagnosis not present

## 2016-03-07 DIAGNOSIS — J3089 Other allergic rhinitis: Secondary | ICD-10-CM | POA: Diagnosis not present

## 2016-03-07 DIAGNOSIS — J301 Allergic rhinitis due to pollen: Secondary | ICD-10-CM | POA: Diagnosis not present

## 2016-03-07 DIAGNOSIS — J3081 Allergic rhinitis due to animal (cat) (dog) hair and dander: Secondary | ICD-10-CM | POA: Diagnosis not present

## 2016-03-10 MED FILL — FLUTICASONE PROP 50 MCG SPR: 50 | 30 days supply | Qty: 16 | Fill #1

## 2016-03-10 MED FILL — PANTOPRAZOLE SOD DR 40 MG T: 40 | 90 days supply | Qty: 90 | Fill #2

## 2016-03-12 ENCOUNTER — Encounter: Payer: Self-pay | Admitting: Family Medicine

## 2016-03-12 ENCOUNTER — Ambulatory Visit (INDEPENDENT_AMBULATORY_CARE_PROVIDER_SITE_OTHER): Payer: 59 | Admitting: Family Medicine

## 2016-03-12 VITALS — BP 108/60 | HR 60 | Ht 64.0 in | Wt 107.0 lb

## 2016-03-12 DIAGNOSIS — G4700 Insomnia, unspecified: Secondary | ICD-10-CM

## 2016-03-12 DIAGNOSIS — R5383 Other fatigue: Secondary | ICD-10-CM

## 2016-03-12 DIAGNOSIS — Z0000 Encounter for general adult medical examination without abnormal findings: Secondary | ICD-10-CM | POA: Diagnosis not present

## 2016-03-12 DIAGNOSIS — R636 Underweight: Secondary | ICD-10-CM

## 2016-03-12 DIAGNOSIS — J302 Other seasonal allergic rhinitis: Secondary | ICD-10-CM | POA: Diagnosis not present

## 2016-03-12 DIAGNOSIS — K2900 Acute gastritis without bleeding: Secondary | ICD-10-CM

## 2016-03-12 DIAGNOSIS — K2960 Other gastritis without bleeding: Secondary | ICD-10-CM

## 2016-03-12 DIAGNOSIS — K219 Gastro-esophageal reflux disease without esophagitis: Secondary | ICD-10-CM | POA: Diagnosis not present

## 2016-03-12 DIAGNOSIS — Z5181 Encounter for therapeutic drug level monitoring: Secondary | ICD-10-CM

## 2016-03-12 LAB — CBC WITH DIFFERENTIAL/PLATELET
BASOS ABS: 0 {cells}/uL (ref 0–200)
Basophils Relative: 0 %
EOS ABS: 166 {cells}/uL (ref 15–500)
EOS PCT: 2 %
HEMATOCRIT: 42.7 % (ref 35.0–45.0)
HEMOGLOBIN: 13.6 g/dL (ref 11.7–15.5)
LYMPHS ABS: 1660 {cells}/uL (ref 850–3900)
Lymphocytes Relative: 20 %
MCH: 28.9 pg (ref 27.0–33.0)
MCHC: 31.9 g/dL — ABNORMAL LOW (ref 32.0–36.0)
MCV: 90.7 fL (ref 80.0–100.0)
MONO ABS: 581 {cells}/uL (ref 200–950)
MPV: 10.6 fL (ref 7.5–12.5)
Monocytes Relative: 7 %
NEUTROS ABS: 5893 {cells}/uL (ref 1500–7800)
NEUTROS PCT: 71 %
Platelets: 276 10*3/uL (ref 140–400)
RBC: 4.71 MIL/uL (ref 3.80–5.10)
RDW: 13.4 % (ref 11.0–15.0)
WBC: 8.3 10*3/uL (ref 4.0–10.5)

## 2016-03-12 LAB — COMPREHENSIVE METABOLIC PANEL
ALT: 11 U/L (ref 6–29)
AST: 20 U/L (ref 10–30)
Albumin: 4.3 g/dL (ref 3.6–5.1)
Alkaline Phosphatase: 52 U/L (ref 33–115)
BUN: 12 mg/dL (ref 7–25)
CALCIUM: 9.2 mg/dL (ref 8.6–10.2)
CHLORIDE: 102 mmol/L (ref 98–110)
CO2: 23 mmol/L (ref 20–31)
Creat: 0.78 mg/dL (ref 0.50–1.10)
GLUCOSE: 76 mg/dL (ref 65–99)
POTASSIUM: 4.4 mmol/L (ref 3.5–5.3)
Sodium: 137 mmol/L (ref 135–146)
Total Bilirubin: 0.9 mg/dL (ref 0.2–1.2)
Total Protein: 6.8 g/dL (ref 6.1–8.1)

## 2016-03-12 LAB — POCT URINALYSIS DIPSTICK
BILIRUBIN UA: NEGATIVE
Glucose, UA: NEGATIVE
Leukocytes, UA: NEGATIVE
Nitrite, UA: NEGATIVE
PH UA: 6
PROTEIN UA: NEGATIVE
RBC UA: NEGATIVE
Spec Grav, UA: >=1.03
Urobilinogen, UA: NEGATIVE

## 2016-03-12 LAB — TSH: TSH: 1.1 m[IU]/L

## 2016-03-12 LAB — MAGNESIUM: Magnesium: 2.1 mg/dL (ref 1.5–2.5)

## 2016-03-12 MED ORDER — ZOLPIDEM TARTRATE 10 MG PO TABS: ORAL_TABLET | ORAL | Status: DC

## 2016-03-12 MED ORDER — PANTOPRAZOLE SODIUM 40 MG PO TBEC: 40.0000 mg | DELAYED_RELEASE_TABLET | Freq: Every day | ORAL | Status: DC

## 2016-03-12 NOTE — Patient Instructions (Signed)

## 2016-03-12 NOTE — Progress Notes (Signed)
Chief Complaint  Patient presents with  . Annual Exam    fasting annual exam with pelvic (last pap 2015). No concerns.    Katherine Macias is a 40 y.o. female who presents for a complete physical.  She has no new concerns.  Here to f/u on chronic problems, med refills.  H/o erosive gastritis, and has recurrent epigastric pain if she stops PPI. She has been taking Protonix daily without any problems. She has recurrent symptoms if she misses it for 2-3 days. Denies dysphagia or chest pain.  She has some mild intermittent heartburn/reflux, and uses Tums prn. This is usually related to dietary issues (ie wine).  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  Requesting refill, has about 8 left.  Allergies: Well treated with current regimen of zyrtec and Flonase, along with continuing to get allergy shots. Fewer headaches, needing sudafed much less frequently than prior to allergy shots.    Immunization History  Administered Date(s) Administered  . Influenza Split 08/02/2013  . Td 04/03/2004, 03/12/2015  . Tdap 05/29/2005   gets flu shots yearly through work  Last Pap smear: 11/2013--candida; no high risk HPV Last mammogram: 05/2012 (baseline at age 26) Last colonoscopy: flex sig 07/2012 Last DEXA: n/a  Dentist: every 6 months  Ophtho: summer 2015, goes every 2 years Exercise: 4 days/week (running and biking, weights at least 2x/wk)  Lipids 11/2013 Lab Results  Component Value Date   CHOL 156 11/07/2013   HDL 83 11/07/2013   LDLCALC 60 11/07/2013   TRIG 63 11/07/2013   CHOLHDL 1.9 11/07/2013   Normal vitamin D 04/2009 (44)  Past Medical History  Diagnosis Date  . Seasonal allergies   . Genital warts 05/2009    treated with liquid nitrogen  . Internal hemorrhoid  07/2012    seen on flex sig  . Erosive gastritis 07/2012  . Allergy     tree/dust,cat,dog,feather,mold,grass,weed--on immunotherapy    Past Surgical History  Procedure Laterality Date  . Wisdom tooth extraction    . Tonsillectomy    . Cervical polypectomy  04/24/08    benign  . Esophagogastroduodenoscopy  07/2012    erosive gastritis  . Flexible sigmoidoscopy  07/2012    internal hemorrhoid    Social History   Social History  . Marital Status: Married    Spouse Name: N/A  . Number of Children: 0  . 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 Smoking status: Never Smoker   . Smokeless tobacco: Never Used  . Alcohol Use: Yes     Comment: 1-2 glasses, 1-2 times per week  . Drug Use: No  . Sexual Activity: Yes    Birth Control/ Protection: Other-see comments     Comment: husband s/p vasectomy   Other Topics Concern  . Not on file   Social History Narrative   Married. 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  No pets   Daily caffeine 1-2/day    Family History  Problem Relation Age of Onset  . Hypertension Mother   . Breast cancer Mother 82  . Hypertension Father   . Breast cancer Paternal Aunt   . Diabetes Paternal Grandmother   . Breast cancer Paternal Aunt   .  Breast cancer Cousin 23    BRCA+  . Colon cancer Neg Hx     Outpatient Encounter Prescriptions as of 03/12/2016  Medication Sig Note  . Calcium-Phosphorus-Vitamin D (CALCIUM GUMMIES PO) Take 1 each by mouth daily.   . cetirizine (ZYRTEC) 10 MG tablet Take 10 mg by mouth daily.   . fluticasone (FLONASE) 50 MCG/ACT nasal spray PLACE 2 SPRAYS INTO THE NOSE DAILY.   . Multiple Vitamins-Calcium (ONE-A-DAY WOMENS FORMULA PO) Take 1 tablet by mouth daily.     . pantoprazole (PROTONIX) 40 MG tablet Take 1 tablet (40 mg total) by mouth daily.   Marland Kitchen zolpidem (AMBIEN) 10 MG tablet 1/2 - 1 tablet at bedtime as needed for insomnia 03/12/2016:  Uses infrequently, related to night shift work (once a week or less, just 1/2 tablet)  . pseudoephedrine (SUDAFED) 120 MG 12 hr tablet Take 120 mg by mouth daily. Reported on 03/12/2016 03/12/2016: Uses prn   No facility-administered encounter medications on file as of 03/12/2016.    No Known Allergies   ROS: The patient denies anorexia, fever, weight changes, vision changes, decreased hearing, ear pain, sore throat, breast concerns, chest pain, palpitations, dizziness, syncope, dyspnea on exertion, cough, swelling, vomiting, diarrhea, constipation, melena, hematochezia, hematuria, incontinence, dysuria, irregular menstrual cycles, vaginal discharge, odor or itch, genital lesions, joint pains, numbness, tingling, weakness, tremor, suspicious skin lesions, depression, abnormal bleeding/bruising, or enlarged lymph nodes.  Headaches improved since being on allergy shots, uses sudafed much less often. Intermittent trouble sleeping, controlled by 1/2 [place] about once a week.   PHYSICAL EXAM:  BP 108/60 mmHg  Pulse 60  Ht '5\' 4"'$  (1.626 m)  Wt 107 lb (48.535 kg)  BMI 18.36 kg/m2  LMP 02/21/2016  General Appearance:  Alert, cooperative, no distress, appears stated age   Head:  Normocephalic, without obvious abnormality, atraumatic   Eyes:  PERRL, conjunctiva/corneas clear, EOM's intact, fundi  benign   Ears:  Normal TM's and external ear canals   Nose:  Nares normal, no drainage or sinus tenderness. Nasal mucosa is pale, slightly edematous; no drainage or purulence  Throat:  Lips, mucosa, and tongue normal; teeth and gums normal   Neck:  Supple; thyroid: no enlargement/tenderness/nodules; no carotid bruit or JVD. no lymphadenopathy  Back:  Spine nontender, no curvature, ROM normal, no CVA tenderness   Lungs:  Clear to auscultation bilaterally without wheezes, rales or ronchi; respirations unlabored   Chest Wall:  No tenderness or deformity   Heart:   Regular rate and rhythm, S1 and S2 normal, no murmur, rub  or gallop   Breast Exam:  No tenderness, masses, or nipple discharge or inversion. No axillary lymphadenopathy   Abdomen:  Soft, non-tender, nondistended, normoactive bowel sounds,  no masses, no hepatosplenomegaly   Genitalia:  Normal external genitalia without lesions. BUS and vagina normal; no cervical motion tenderness. No abnormal vaginal discharge. Uterus and adnexa not enlarged, nontender, no masses. Pap not performed   Rectal:  Not performed due to age<40 and no related complaints   Extremities:  No clubbing, cyanosis or edema   Pulses:  2+ and symmetric all extremities   Skin:  Skin color, texture, turgor normal, no rashes or lesions   Lymph nodes:  Cervical, supraclavicular, and axillary nodes normal   Neurologic:  CNII-XII intact, normal strength, sensation and gait; reflexes 2+ and symmetric throughout    Psych: Normal mood, affect, hygiene and grooming               ASSESSMENT/PLAN:  Annual physical exam - Plan: Visual acuity screening, POCT Urinalysis Dipstick, Comprehensive metabolic panel, CBC with Differential/Platelet, TSH, Magnesium, VITAMIN D 25 Hydroxy (Vit-D Deficiency, Fractures)  Insomnia - sporadic/rare (related to shift work and anxiety). continue rare prn use of ambien - Plan: zolpidem (AMBIEN) 10 MG tablet  Erosive gastritis - continue PPI given recurrent pain when stopped; risks of longterm PPI's reviewed - Plan: pantoprazole (PROTONIX) 40 MG tablet  Other seasonal allergic rhinitis - continue current meds and immunotherapy; overall improved  Gastroesophageal reflux disease, esophagitis presence not specified - Plan: pantoprazole (PROTONIX) 40 MG tablet  Medication monitoring encounter - Plan: Comprehensive metabolic panel, CBC with Differential/Platelet, Magnesium  Underweight - Plan: TSH  Other fatigue - Plan: Comprehensive  metabolic panel, CBC with Differential/Platelet, TSH, VITAMIN D 25 Hydroxy (Vit-D Deficiency, Fractures)   Discussed monthly self breast exams and yearly mammograms after the age of 20; at least 30 minutes of aerobic activity at least 5 days/week, weight-bearing exercise 2-3x/week; proper sunscreen use reviewed; healthy diet, including goals of calcium and vitamin D intake and alcohol recommendations (less than or equal to 1 drink/day) reviewed; regular seatbelt use; changing batteries in smoke detectors. Immunizations UTD, continue yearly flu shots.   Risks of longterm PPI vs untreated reflux were reviewed. Given ongoing need (symptoms if not taken), continue to take vitamins (containing Ca, Mg, B12, etc) to minimize risks of complications.

## 2016-03-13 DIAGNOSIS — J3081 Allergic rhinitis due to animal (cat) (dog) hair and dander: Secondary | ICD-10-CM | POA: Diagnosis not present

## 2016-03-13 DIAGNOSIS — J3089 Other allergic rhinitis: Secondary | ICD-10-CM | POA: Diagnosis not present

## 2016-03-13 DIAGNOSIS — J301 Allergic rhinitis due to pollen: Secondary | ICD-10-CM | POA: Diagnosis not present

## 2016-03-13 LAB — VITAMIN D 25 HYDROXY (VIT D DEFICIENCY, FRACTURES): Vit D, 25-Hydroxy: 41 ng/mL (ref 30–100)

## 2016-03-17 DIAGNOSIS — J3081 Allergic rhinitis due to animal (cat) (dog) hair and dander: Secondary | ICD-10-CM | POA: Diagnosis not present

## 2016-03-17 DIAGNOSIS — J301 Allergic rhinitis due to pollen: Secondary | ICD-10-CM | POA: Diagnosis not present

## 2016-03-17 DIAGNOSIS — J3089 Other allergic rhinitis: Secondary | ICD-10-CM | POA: Diagnosis not present

## 2016-03-26 DIAGNOSIS — J301 Allergic rhinitis due to pollen: Secondary | ICD-10-CM | POA: Diagnosis not present

## 2016-03-26 DIAGNOSIS — J3081 Allergic rhinitis due to animal (cat) (dog) hair and dander: Secondary | ICD-10-CM | POA: Diagnosis not present

## 2016-03-26 DIAGNOSIS — J3089 Other allergic rhinitis: Secondary | ICD-10-CM | POA: Diagnosis not present

## 2016-04-07 DIAGNOSIS — J301 Allergic rhinitis due to pollen: Secondary | ICD-10-CM | POA: Diagnosis not present

## 2016-04-07 DIAGNOSIS — J3089 Other allergic rhinitis: Secondary | ICD-10-CM | POA: Diagnosis not present

## 2016-04-07 DIAGNOSIS — J3081 Allergic rhinitis due to animal (cat) (dog) hair and dander: Secondary | ICD-10-CM | POA: Diagnosis not present

## 2016-04-14 DIAGNOSIS — J3081 Allergic rhinitis due to animal (cat) (dog) hair and dander: Secondary | ICD-10-CM | POA: Diagnosis not present

## 2016-04-14 DIAGNOSIS — J3089 Other allergic rhinitis: Secondary | ICD-10-CM | POA: Diagnosis not present

## 2016-04-14 DIAGNOSIS — J301 Allergic rhinitis due to pollen: Secondary | ICD-10-CM | POA: Diagnosis not present

## 2016-04-22 MED FILL — ZOLPIDEM TARTRATE 10 MG TAB: 10 | 30 days supply | Qty: 30 | Fill #0

## 2016-04-23 DIAGNOSIS — J3081 Allergic rhinitis due to animal (cat) (dog) hair and dander: Secondary | ICD-10-CM | POA: Diagnosis not present

## 2016-04-23 DIAGNOSIS — J3089 Other allergic rhinitis: Secondary | ICD-10-CM | POA: Diagnosis not present

## 2016-04-23 DIAGNOSIS — J301 Allergic rhinitis due to pollen: Secondary | ICD-10-CM | POA: Diagnosis not present

## 2016-04-28 DIAGNOSIS — J3089 Other allergic rhinitis: Secondary | ICD-10-CM | POA: Diagnosis not present

## 2016-04-28 DIAGNOSIS — J301 Allergic rhinitis due to pollen: Secondary | ICD-10-CM | POA: Diagnosis not present

## 2016-04-28 DIAGNOSIS — J3081 Allergic rhinitis due to animal (cat) (dog) hair and dander: Secondary | ICD-10-CM | POA: Diagnosis not present

## 2016-05-07 DIAGNOSIS — J301 Allergic rhinitis due to pollen: Secondary | ICD-10-CM | POA: Diagnosis not present

## 2016-05-07 DIAGNOSIS — J3089 Other allergic rhinitis: Secondary | ICD-10-CM | POA: Diagnosis not present

## 2016-05-07 DIAGNOSIS — J3081 Allergic rhinitis due to animal (cat) (dog) hair and dander: Secondary | ICD-10-CM | POA: Diagnosis not present

## 2016-05-19 DIAGNOSIS — J3089 Other allergic rhinitis: Secondary | ICD-10-CM | POA: Diagnosis not present

## 2016-05-19 DIAGNOSIS — J301 Allergic rhinitis due to pollen: Secondary | ICD-10-CM | POA: Diagnosis not present

## 2016-05-19 DIAGNOSIS — J3081 Allergic rhinitis due to animal (cat) (dog) hair and dander: Secondary | ICD-10-CM | POA: Diagnosis not present

## 2016-05-26 DIAGNOSIS — J3081 Allergic rhinitis due to animal (cat) (dog) hair and dander: Secondary | ICD-10-CM | POA: Diagnosis not present

## 2016-05-26 DIAGNOSIS — J3089 Other allergic rhinitis: Secondary | ICD-10-CM | POA: Diagnosis not present

## 2016-05-26 DIAGNOSIS — J301 Allergic rhinitis due to pollen: Secondary | ICD-10-CM | POA: Diagnosis not present

## 2016-06-06 DIAGNOSIS — J3081 Allergic rhinitis due to animal (cat) (dog) hair and dander: Secondary | ICD-10-CM | POA: Diagnosis not present

## 2016-06-06 DIAGNOSIS — J3089 Other allergic rhinitis: Secondary | ICD-10-CM | POA: Diagnosis not present

## 2016-06-06 DIAGNOSIS — J301 Allergic rhinitis due to pollen: Secondary | ICD-10-CM | POA: Diagnosis not present

## 2016-06-13 MED FILL — PANTOPRAZOLE SOD DR 40 MG T: 40 | 90 days supply | Qty: 90 | Fill #0

## 2016-06-17 DIAGNOSIS — J3089 Other allergic rhinitis: Secondary | ICD-10-CM | POA: Diagnosis not present

## 2016-06-17 DIAGNOSIS — J301 Allergic rhinitis due to pollen: Secondary | ICD-10-CM | POA: Diagnosis not present

## 2016-06-17 DIAGNOSIS — J3081 Allergic rhinitis due to animal (cat) (dog) hair and dander: Secondary | ICD-10-CM | POA: Diagnosis not present

## 2016-06-24 DIAGNOSIS — J3089 Other allergic rhinitis: Secondary | ICD-10-CM | POA: Diagnosis not present

## 2016-06-24 DIAGNOSIS — J3081 Allergic rhinitis due to animal (cat) (dog) hair and dander: Secondary | ICD-10-CM | POA: Diagnosis not present

## 2016-06-24 DIAGNOSIS — J301 Allergic rhinitis due to pollen: Secondary | ICD-10-CM | POA: Diagnosis not present

## 2016-07-03 DIAGNOSIS — J3089 Other allergic rhinitis: Secondary | ICD-10-CM | POA: Diagnosis not present

## 2016-07-03 DIAGNOSIS — J3081 Allergic rhinitis due to animal (cat) (dog) hair and dander: Secondary | ICD-10-CM | POA: Diagnosis not present

## 2016-07-03 DIAGNOSIS — J301 Allergic rhinitis due to pollen: Secondary | ICD-10-CM | POA: Diagnosis not present

## 2016-07-08 DIAGNOSIS — J3089 Other allergic rhinitis: Secondary | ICD-10-CM | POA: Diagnosis not present

## 2016-07-08 DIAGNOSIS — J301 Allergic rhinitis due to pollen: Secondary | ICD-10-CM | POA: Diagnosis not present

## 2016-07-08 DIAGNOSIS — J3081 Allergic rhinitis due to animal (cat) (dog) hair and dander: Secondary | ICD-10-CM | POA: Diagnosis not present

## 2016-07-15 DIAGNOSIS — J3081 Allergic rhinitis due to animal (cat) (dog) hair and dander: Secondary | ICD-10-CM | POA: Diagnosis not present

## 2016-07-15 DIAGNOSIS — J3089 Other allergic rhinitis: Secondary | ICD-10-CM | POA: Diagnosis not present

## 2016-07-15 DIAGNOSIS — J301 Allergic rhinitis due to pollen: Secondary | ICD-10-CM | POA: Diagnosis not present

## 2016-07-30 DIAGNOSIS — J3081 Allergic rhinitis due to animal (cat) (dog) hair and dander: Secondary | ICD-10-CM | POA: Diagnosis not present

## 2016-07-30 DIAGNOSIS — J3089 Other allergic rhinitis: Secondary | ICD-10-CM | POA: Diagnosis not present

## 2016-07-30 DIAGNOSIS — J301 Allergic rhinitis due to pollen: Secondary | ICD-10-CM | POA: Diagnosis not present

## 2016-08-04 DIAGNOSIS — J3081 Allergic rhinitis due to animal (cat) (dog) hair and dander: Secondary | ICD-10-CM | POA: Diagnosis not present

## 2016-08-04 DIAGNOSIS — J301 Allergic rhinitis due to pollen: Secondary | ICD-10-CM | POA: Diagnosis not present

## 2016-08-04 DIAGNOSIS — J3089 Other allergic rhinitis: Secondary | ICD-10-CM | POA: Diagnosis not present

## 2016-08-13 DIAGNOSIS — J3089 Other allergic rhinitis: Secondary | ICD-10-CM | POA: Diagnosis not present

## 2016-08-13 DIAGNOSIS — J301 Allergic rhinitis due to pollen: Secondary | ICD-10-CM | POA: Diagnosis not present

## 2016-08-13 DIAGNOSIS — J3081 Allergic rhinitis due to animal (cat) (dog) hair and dander: Secondary | ICD-10-CM | POA: Diagnosis not present

## 2016-08-24 ENCOUNTER — Encounter: Payer: Self-pay | Admitting: Family Medicine

## 2016-08-25 MED ORDER — CHLOROQUINE PHOSPHATE 500 MG PO TABS: ORAL_TABLET | ORAL | 0 refills | Status: DC

## 2016-08-25 MED FILL — CHLOROQUINE PH 250 MG TAB: 250 | 48 days supply | Qty: 14 | Fill #0

## 2016-08-25 NOTE — Telephone Encounter (Signed)
Please change her rx for chloroquine to 7 tablets (already sent earlier this morning for #6)

## 2016-08-26 MED FILL — FLUTICASONE PROP 50 MCG SPR: 50 | 30 days supply | Qty: 16 | Fill #2

## 2016-08-28 DIAGNOSIS — J3089 Other allergic rhinitis: Secondary | ICD-10-CM | POA: Diagnosis not present

## 2016-08-28 DIAGNOSIS — J301 Allergic rhinitis due to pollen: Secondary | ICD-10-CM | POA: Diagnosis not present

## 2016-08-28 DIAGNOSIS — J3081 Allergic rhinitis due to animal (cat) (dog) hair and dander: Secondary | ICD-10-CM | POA: Diagnosis not present

## 2016-09-01 DIAGNOSIS — J3081 Allergic rhinitis due to animal (cat) (dog) hair and dander: Secondary | ICD-10-CM | POA: Diagnosis not present

## 2016-09-01 DIAGNOSIS — J3089 Other allergic rhinitis: Secondary | ICD-10-CM | POA: Diagnosis not present

## 2016-09-01 DIAGNOSIS — J301 Allergic rhinitis due to pollen: Secondary | ICD-10-CM | POA: Diagnosis not present

## 2016-09-03 DIAGNOSIS — J3089 Other allergic rhinitis: Secondary | ICD-10-CM | POA: Diagnosis not present

## 2016-09-03 DIAGNOSIS — J3081 Allergic rhinitis due to animal (cat) (dog) hair and dander: Secondary | ICD-10-CM | POA: Diagnosis not present

## 2016-09-03 DIAGNOSIS — J301 Allergic rhinitis due to pollen: Secondary | ICD-10-CM | POA: Diagnosis not present

## 2016-09-05 DIAGNOSIS — J301 Allergic rhinitis due to pollen: Secondary | ICD-10-CM | POA: Diagnosis not present

## 2016-09-05 DIAGNOSIS — J3089 Other allergic rhinitis: Secondary | ICD-10-CM | POA: Diagnosis not present

## 2016-09-05 DIAGNOSIS — J3081 Allergic rhinitis due to animal (cat) (dog) hair and dander: Secondary | ICD-10-CM | POA: Diagnosis not present

## 2016-09-08 MED FILL — PANTOPRAZOLE SOD DR 40 MG T: 40 | 90 days supply | Qty: 90 | Fill #1

## 2016-09-09 DIAGNOSIS — J3089 Other allergic rhinitis: Secondary | ICD-10-CM | POA: Diagnosis not present

## 2016-09-09 DIAGNOSIS — J3081 Allergic rhinitis due to animal (cat) (dog) hair and dander: Secondary | ICD-10-CM | POA: Diagnosis not present

## 2016-09-09 DIAGNOSIS — J301 Allergic rhinitis due to pollen: Secondary | ICD-10-CM | POA: Diagnosis not present

## 2016-09-29 DIAGNOSIS — J3089 Other allergic rhinitis: Secondary | ICD-10-CM | POA: Diagnosis not present

## 2016-09-29 DIAGNOSIS — J3081 Allergic rhinitis due to animal (cat) (dog) hair and dander: Secondary | ICD-10-CM | POA: Diagnosis not present

## 2016-09-29 DIAGNOSIS — J301 Allergic rhinitis due to pollen: Secondary | ICD-10-CM | POA: Diagnosis not present

## 2016-10-08 DIAGNOSIS — J3081 Allergic rhinitis due to animal (cat) (dog) hair and dander: Secondary | ICD-10-CM | POA: Diagnosis not present

## 2016-10-08 DIAGNOSIS — J301 Allergic rhinitis due to pollen: Secondary | ICD-10-CM | POA: Diagnosis not present

## 2016-10-08 DIAGNOSIS — J3089 Other allergic rhinitis: Secondary | ICD-10-CM | POA: Diagnosis not present

## 2016-10-14 DIAGNOSIS — J3081 Allergic rhinitis due to animal (cat) (dog) hair and dander: Secondary | ICD-10-CM | POA: Diagnosis not present

## 2016-10-14 DIAGNOSIS — J301 Allergic rhinitis due to pollen: Secondary | ICD-10-CM | POA: Diagnosis not present

## 2016-10-14 DIAGNOSIS — J3089 Other allergic rhinitis: Secondary | ICD-10-CM | POA: Diagnosis not present

## 2016-10-21 DIAGNOSIS — J3089 Other allergic rhinitis: Secondary | ICD-10-CM | POA: Diagnosis not present

## 2016-10-21 DIAGNOSIS — J301 Allergic rhinitis due to pollen: Secondary | ICD-10-CM | POA: Diagnosis not present

## 2016-10-21 DIAGNOSIS — J3081 Allergic rhinitis due to animal (cat) (dog) hair and dander: Secondary | ICD-10-CM | POA: Diagnosis not present

## 2016-10-30 DIAGNOSIS — J3089 Other allergic rhinitis: Secondary | ICD-10-CM | POA: Diagnosis not present

## 2016-10-30 DIAGNOSIS — J3081 Allergic rhinitis due to animal (cat) (dog) hair and dander: Secondary | ICD-10-CM | POA: Diagnosis not present

## 2016-10-30 DIAGNOSIS — J301 Allergic rhinitis due to pollen: Secondary | ICD-10-CM | POA: Diagnosis not present

## 2016-11-14 DIAGNOSIS — J3089 Other allergic rhinitis: Secondary | ICD-10-CM | POA: Diagnosis not present

## 2016-11-14 DIAGNOSIS — J3081 Allergic rhinitis due to animal (cat) (dog) hair and dander: Secondary | ICD-10-CM | POA: Diagnosis not present

## 2016-11-14 DIAGNOSIS — J301 Allergic rhinitis due to pollen: Secondary | ICD-10-CM | POA: Diagnosis not present

## 2016-11-18 DIAGNOSIS — J301 Allergic rhinitis due to pollen: Secondary | ICD-10-CM | POA: Diagnosis not present

## 2016-11-19 DIAGNOSIS — J3089 Other allergic rhinitis: Secondary | ICD-10-CM | POA: Diagnosis not present

## 2016-11-19 DIAGNOSIS — J3081 Allergic rhinitis due to animal (cat) (dog) hair and dander: Secondary | ICD-10-CM | POA: Diagnosis not present

## 2016-11-25 DIAGNOSIS — J301 Allergic rhinitis due to pollen: Secondary | ICD-10-CM | POA: Diagnosis not present

## 2016-11-25 DIAGNOSIS — J3089 Other allergic rhinitis: Secondary | ICD-10-CM | POA: Diagnosis not present

## 2016-11-25 DIAGNOSIS — J3081 Allergic rhinitis due to animal (cat) (dog) hair and dander: Secondary | ICD-10-CM | POA: Diagnosis not present

## 2016-12-10 DIAGNOSIS — J3081 Allergic rhinitis due to animal (cat) (dog) hair and dander: Secondary | ICD-10-CM | POA: Diagnosis not present

## 2016-12-10 DIAGNOSIS — J301 Allergic rhinitis due to pollen: Secondary | ICD-10-CM | POA: Diagnosis not present

## 2016-12-10 DIAGNOSIS — J3089 Other allergic rhinitis: Secondary | ICD-10-CM | POA: Diagnosis not present

## 2016-12-17 DIAGNOSIS — J301 Allergic rhinitis due to pollen: Secondary | ICD-10-CM | POA: Diagnosis not present

## 2016-12-17 DIAGNOSIS — J3081 Allergic rhinitis due to animal (cat) (dog) hair and dander: Secondary | ICD-10-CM | POA: Diagnosis not present

## 2016-12-17 DIAGNOSIS — J3089 Other allergic rhinitis: Secondary | ICD-10-CM | POA: Diagnosis not present

## 2016-12-22 DIAGNOSIS — J3081 Allergic rhinitis due to animal (cat) (dog) hair and dander: Secondary | ICD-10-CM | POA: Diagnosis not present

## 2016-12-22 DIAGNOSIS — J301 Allergic rhinitis due to pollen: Secondary | ICD-10-CM | POA: Diagnosis not present

## 2016-12-22 DIAGNOSIS — J3089 Other allergic rhinitis: Secondary | ICD-10-CM | POA: Diagnosis not present

## 2016-12-25 MED FILL — PANTOPRAZOLE SOD DR 40 MG T: 40 | 90 days supply | Qty: 90 | Fill #2

## 2016-12-31 DIAGNOSIS — J3089 Other allergic rhinitis: Secondary | ICD-10-CM | POA: Diagnosis not present

## 2016-12-31 DIAGNOSIS — J301 Allergic rhinitis due to pollen: Secondary | ICD-10-CM | POA: Diagnosis not present

## 2016-12-31 DIAGNOSIS — J3081 Allergic rhinitis due to animal (cat) (dog) hair and dander: Secondary | ICD-10-CM | POA: Diagnosis not present

## 2017-01-02 DIAGNOSIS — J301 Allergic rhinitis due to pollen: Secondary | ICD-10-CM | POA: Diagnosis not present

## 2017-01-02 DIAGNOSIS — J3089 Other allergic rhinitis: Secondary | ICD-10-CM | POA: Diagnosis not present

## 2017-01-02 DIAGNOSIS — J3081 Allergic rhinitis due to animal (cat) (dog) hair and dander: Secondary | ICD-10-CM | POA: Diagnosis not present

## 2017-01-07 DIAGNOSIS — J3089 Other allergic rhinitis: Secondary | ICD-10-CM | POA: Diagnosis not present

## 2017-01-07 DIAGNOSIS — J301 Allergic rhinitis due to pollen: Secondary | ICD-10-CM | POA: Diagnosis not present

## 2017-01-07 DIAGNOSIS — J3081 Allergic rhinitis due to animal (cat) (dog) hair and dander: Secondary | ICD-10-CM | POA: Diagnosis not present

## 2017-01-15 ENCOUNTER — Encounter: Payer: Self-pay | Admitting: Family Medicine

## 2017-01-15 ENCOUNTER — Other Ambulatory Visit: Payer: Self-pay | Admitting: Family Medicine

## 2017-01-15 DIAGNOSIS — J301 Allergic rhinitis due to pollen: Secondary | ICD-10-CM | POA: Diagnosis not present

## 2017-01-15 DIAGNOSIS — J3089 Other allergic rhinitis: Secondary | ICD-10-CM | POA: Diagnosis not present

## 2017-01-15 DIAGNOSIS — Z803 Family history of malignant neoplasm of breast: Secondary | ICD-10-CM

## 2017-01-15 DIAGNOSIS — Z1231 Encounter for screening mammogram for malignant neoplasm of breast: Secondary | ICD-10-CM

## 2017-01-15 DIAGNOSIS — J3081 Allergic rhinitis due to animal (cat) (dog) hair and dander: Secondary | ICD-10-CM | POA: Diagnosis not present

## 2017-01-15 NOTE — Telephone Encounter (Signed)
FYI--I sent reply to pt telling her we would be happy to phone in the [place] refill (#30) rather than make her come pick it up, but I wanted to verify the pharmacy she wanted to pick it up from.  She previously took printed rx, so I'm not sure if she prefers to fill it [facility]

## 2017-01-19 ENCOUNTER — Other Ambulatory Visit: Payer: Self-pay | Admitting: *Deleted

## 2017-01-19 DIAGNOSIS — G4700 Insomnia, unspecified: Secondary | ICD-10-CM

## 2017-01-19 MED ORDER — ZOLPIDEM TARTRATE 10 MG PO TABS: ORAL_TABLET | ORAL | 0 refills | Status: DC

## 2017-01-19 MED FILL — ZOLPIDEM TARTRATE 10 MG TAB: 10 | 30 days supply | Qty: 30 | Fill #0

## 2017-01-22 DIAGNOSIS — J301 Allergic rhinitis due to pollen: Secondary | ICD-10-CM | POA: Diagnosis not present

## 2017-01-22 DIAGNOSIS — J3081 Allergic rhinitis due to animal (cat) (dog) hair and dander: Secondary | ICD-10-CM | POA: Diagnosis not present

## 2017-01-22 DIAGNOSIS — J3089 Other allergic rhinitis: Secondary | ICD-10-CM | POA: Diagnosis not present

## 2017-01-27 DIAGNOSIS — J3081 Allergic rhinitis due to animal (cat) (dog) hair and dander: Secondary | ICD-10-CM | POA: Diagnosis not present

## 2017-01-27 DIAGNOSIS — J301 Allergic rhinitis due to pollen: Secondary | ICD-10-CM | POA: Diagnosis not present

## 2017-01-27 DIAGNOSIS — J3089 Other allergic rhinitis: Secondary | ICD-10-CM | POA: Diagnosis not present

## 2017-02-05 DIAGNOSIS — J3081 Allergic rhinitis due to animal (cat) (dog) hair and dander: Secondary | ICD-10-CM | POA: Diagnosis not present

## 2017-02-05 DIAGNOSIS — J301 Allergic rhinitis due to pollen: Secondary | ICD-10-CM | POA: Diagnosis not present

## 2017-02-05 DIAGNOSIS — J3089 Other allergic rhinitis: Secondary | ICD-10-CM | POA: Diagnosis not present

## 2017-02-11 ENCOUNTER — Ambulatory Visit
Admission: RE | Admit: 2017-02-11 | Discharge: 2017-02-11 | Disposition: A | Discharge status: Home / Self Care | Payer: 59 | Source: Ambulatory Visit | Attending: Family Medicine | Admitting: Family Medicine

## 2017-02-11 DIAGNOSIS — Z1231 Encounter for screening mammogram for malignant neoplasm of breast: Secondary | ICD-10-CM | POA: Diagnosis not present

## 2017-02-11 DIAGNOSIS — Z803 Family history of malignant neoplasm of breast: Secondary | ICD-10-CM

## 2017-02-12 DIAGNOSIS — J301 Allergic rhinitis due to pollen: Secondary | ICD-10-CM | POA: Diagnosis not present

## 2017-02-12 DIAGNOSIS — J3081 Allergic rhinitis due to animal (cat) (dog) hair and dander: Secondary | ICD-10-CM | POA: Diagnosis not present

## 2017-02-12 DIAGNOSIS — J3089 Other allergic rhinitis: Secondary | ICD-10-CM | POA: Diagnosis not present

## 2017-02-18 DIAGNOSIS — J3089 Other allergic rhinitis: Secondary | ICD-10-CM | POA: Diagnosis not present

## 2017-02-18 DIAGNOSIS — J3081 Allergic rhinitis due to animal (cat) (dog) hair and dander: Secondary | ICD-10-CM | POA: Diagnosis not present

## 2017-02-18 DIAGNOSIS — J301 Allergic rhinitis due to pollen: Secondary | ICD-10-CM | POA: Diagnosis not present

## 2017-02-26 DIAGNOSIS — J3081 Allergic rhinitis due to animal (cat) (dog) hair and dander: Secondary | ICD-10-CM | POA: Diagnosis not present

## 2017-02-26 DIAGNOSIS — J3089 Other allergic rhinitis: Secondary | ICD-10-CM | POA: Diagnosis not present

## 2017-02-26 DIAGNOSIS — J301 Allergic rhinitis due to pollen: Secondary | ICD-10-CM | POA: Diagnosis not present

## 2017-03-02 DIAGNOSIS — J301 Allergic rhinitis due to pollen: Secondary | ICD-10-CM | POA: Diagnosis not present

## 2017-03-02 DIAGNOSIS — J3081 Allergic rhinitis due to animal (cat) (dog) hair and dander: Secondary | ICD-10-CM | POA: Diagnosis not present

## 2017-03-02 DIAGNOSIS — J3089 Other allergic rhinitis: Secondary | ICD-10-CM | POA: Diagnosis not present

## 2017-03-10 DIAGNOSIS — J301 Allergic rhinitis due to pollen: Secondary | ICD-10-CM | POA: Diagnosis not present

## 2017-03-10 DIAGNOSIS — J3081 Allergic rhinitis due to animal (cat) (dog) hair and dander: Secondary | ICD-10-CM | POA: Diagnosis not present

## 2017-03-10 DIAGNOSIS — J3089 Other allergic rhinitis: Secondary | ICD-10-CM | POA: Diagnosis not present

## 2017-03-13 NOTE — Progress Notes (Signed)
Chief Complaint  Patient presents with  . Annual Exam    fasting annual exam with pap, no concerns. Also has form to filled out today.    Katherine Macias is a 41 y.o. female who presents for a complete physical.  She has the following concerns:  She went to [place] in December looking for mission trips.  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 She sold her house, and will be leaving July 4th.  Will be there for 6 months. Then going on a [employer], docked off the [place] of [place].  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  H/o erosive gastritis, and has recurrent epigastric pain if she stops PPI. She has been taking Protonix daily without any problems. She has recurrent symptoms if she misses it for 2-3 days. Denies dysphagia or chest pain.  She has some mild intermittent heartburn/reflux, and uses Tums prn (about once a month, triggered by spicy food, red wine).   Insomnia: Infrequent, and well treated with just 1/2 tablet of Ambien as needed, usually about once a week or less (max 3x/week if a lot of stress).Uses it related to stress, or working third shift.  Last filled in March, hasn't started that bottle yet. She also takes melatonin most days, and that helps, especially with shift changes.  Allergies: Well treated with current regimen of zyrtec and Flonase, along with continuing to get allergy shots. These are working well, only uses Sudafed when she has a cold.   Immunization History  Administered Date(s) Administered  . Influenza Split 08/02/2013  . Td 04/03/2004, 03/12/2015  . Tdap 05/29/2005  has gotten Hepatitis A once, and plans to f/u with travel clinic later this week. gets flu shots yearly through work  Last Pap smear: 11/2013--candida; no high risk HPV Last mammogram: 02/2017, normal Last colonoscopy: flex sig 07/2012 Last DEXA: n/a  Dentist: every 6 months  Ophtho: goes every 2 years Exercise: 4  days/week (running and biking, weights at least 2x/wk)  Lipids: Lab Results  Component Value Date   CHOL 156 11/07/2013   HDL 83 11/07/2013   LDLCALC 60 11/07/2013   TRIG 63 11/07/2013   CHOLHDL 1.9 11/07/2013   Vitamin D-OH level 41 03/2016 Had CBC, TSH, c-met and Mg 03/2016, all normal.  Past Medical History:  Diagnosis Date  . Allergy    tree/dust,cat,dog,feather,mold,grass,weed--on immunotherapy  . Erosive gastritis 07/2012  . Genital warts 05/2009   treated with liquid nitrogen  . Internal hemorrhoid 07/2012   seen on flex sig  . Seasonal allergies     Past Surgical History:  Procedure Laterality Date  . CERVICAL POLYPECTOMY  04/24/08   benign  . ESOPHAGOGASTRODUODENOSCOPY  07/2012   erosive gastritis  . FLEXIBLE SIGMOIDOSCOPY  07/2012   internal hemorrhoid  . TONSILLECTOMY    . WISDOM TOOTH EXTRACTION      Social History   Social History  . Marital status: Married    Spouse name: N/A  . Number of children: 0  . Years of education: N/A   Occupational History  . Pharmacist [employer]   Social History Main Topics  . Smoking status: Never Smoker  . Smokeless tobacco: Never Used  . Alcohol use Yes     Comment: 1-2 glasses, 1-2 times per week  . Drug use: No  . Sexual activity: Yes    Birth control/ protection: Other-see comments  Comment: husband s/p vasectomy   Other Topics Concern  . Not on file   Social History Narrative   Married. 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  No pets   Daily caffeine 1-2/day    Family History  Problem Relation Age of Onset  . Hypertension Mother   . Breast cancer Mother 42  . Hypertension Father   . Breast cancer Paternal Aunt   . Diabetes Paternal Grandmother   . Breast cancer Paternal Aunt   . Breast cancer Cousin 56       BRCA+  . Colon cancer Neg Hx     Outpatient Encounter Prescriptions as of 03/16/2017  Medication Sig Note  . Calcium-Phosphorus-Vitamin D (CALCIUM GUMMIES PO) Take 1 each  by mouth daily.   . cetirizine (ZYRTEC) 10 MG tablet Take 10 mg by mouth daily.   . fluticasone (FLONASE) 50 MCG/ACT nasal spray PLACE 2 SPRAYS INTO THE NOSE DAILY.   . Melatonin 5 MG TABS Take 5 mg by mouth at bedtime.   . Multiple Vitamins-Calcium (ONE-A-DAY WOMENS FORMULA PO) Take 1 tablet by mouth daily.     . pantoprazole (PROTONIX) 40 MG tablet Take 1 tablet (40 mg total) by mouth daily.   Marland Kitchen zolpidem (AMBIEN) 10 MG tablet 1/2 - 1 tablet at bedtime as needed for insomnia   . pseudoephedrine (SUDAFED) 120 MG 12 hr tablet Take 120 mg by mouth daily. Reported on 03/12/2016 03/12/2016: Uses prn  . [DISCONTINUED] chloroquine (ARALEN) 500 MG tablet Take one tablet weekly. Begin 1-2 weeks prior to travel, continue while in endemic area and for 4 weeks after leaving endemic area.    No facility-administered encounter medications on file as of 03/16/2017.     No Known Allergies   ROS: The patient denies anorexia, fever, weight changes, headaches, vision changes, decreased hearing, ear pain, sore throat, breast concerns, chest pain, palpitations, dizziness, syncope, dyspnea on exertion, cough, swelling, vomiting, diarrhea, constipation, melena, hematochezia, hematuria, incontinence, dysuria, irregular menstrual cycles, vaginal discharge, odor or itch, genital lesions, joint pains, numbness, tingling, weakness, tremor, suspicious skin lesions, depression, abnormal bleeding/bruising, or enlarged lymph nodes.  Intermittent trouble sleeping, controlled by 1/2 ambien prn and daily melatonin.   PHYSICAL EXAM:  BP 114/70 (BP Location: Left Arm, Patient Position: Sitting, Cuff Size: Normal)   Pulse 68   Ht '5\' 4"'$  (1.626 m)   Wt 109 lb (49.4 kg)   LMP 02/27/2017 (Exact Date)   BMI 18.71 kg/m   Wt Readings from Last 3 Encounters:  03/16/17 109 lb (49.4 kg)  03/12/16 107 lb (48.5 kg)  03/12/15 107 lb 12.8 oz (48.9 kg)    General Appearance:  Alert, cooperative, no distress, appears stated age    Head:  Normocephalic, without obvious abnormality, atraumatic   Eyes:  PERRL, conjunctiva/corneas clear, EOM's intact, fundi benign   Ears:  Normal TM's and external ear canals   Nose:  Nares normal, no drainage or sinus tenderness. Nasal mucosa is pale, slightly edematous; no drainage or purulence  Throat:  Lips, mucosa, and tongue normal; teeth and gums normal   Neck:  Supple; thyroid: no enlargement/tenderness/nodules; no carotid bruit or JVD. No lymphadenopathy  Back:  Spine nontender, no curvature, ROM normal, no CVA tenderness   Lungs:  Clear to auscultation bilaterally without wheezes, rales or ronchi; respirations unlabored   Chest Wall:  No tenderness or deformity   Heart:  Regular rate and rhythm, S1 and S2 normal, no murmur, rub or gallop   Breast Exam:  No  tenderness, masses, or nipple discharge or inversion. No axillary lymphadenopathy   Abdomen:  Soft, non-tender, nondistended, normoactive bowel sounds, no masses, no hepatosplenomegaly   Genitalia:  Normal external genitalia without lesions. BUS and vagina normal; no cervical motion tenderness. No abnormal vaginal discharge. Uterus and adnexa not enlarged, nontender, no masses. Pap performed. Stenotic cervical os.  Rectal:  Not performed due to age<40 and no related complaints   Extremities:  No clubbing, cyanosis or edema   Pulses:  2+ and symmetric all extremities   Skin:  Skin color, texture, turgor normal, no rashes or lesions   Lymph nodes:  Cervical, supraclavicular, and axillary nodes normal   Neurologic:  CNII-XII intact, normal strength, sensation and gait; reflexes 2+ and symmetric throughout    Psych: Normal mood, affect, hygiene and grooming   ASSESSMENT/PLAN:  Annual physical exam - Plan: Visual acuity screening, POCT Urinalysis Dipstick, Cytology - PAP [facility]  Insomnia, unspecified type - intermittent,  controlled with 1/2 tablet of ambien prn  Erosive gastritis - well controlled with daily protonix  Other seasonal allergic rhinitis - significantly improved with allergy shots    Discussed monthly self breast exams and yearly mammograms after the age of 53; at least 30 minutes of aerobic activity at least 5 days/week, weight-bearing exercise 2-3x/week; proper sunscreen use reviewed; healthy diet, including goals of calcium and vitamin D intake and alcohol recommendations (less than or equal to 1 drink/day) reviewed; regular seatbelt use; changing batteries in smoke detectors. Immunizations UTD, continue yearly flu shots. To travel clinic for travel vaccines. Colonoscopy age 64.  Continue long-term PPI, since needed, and continue supplements/vitamins.  Labs all normal last year, not needed again this year.

## 2017-03-16 ENCOUNTER — Ambulatory Visit (INDEPENDENT_AMBULATORY_CARE_PROVIDER_SITE_OTHER): Payer: 59 | Admitting: Family Medicine

## 2017-03-16 ENCOUNTER — Encounter: Payer: Self-pay | Admitting: Family Medicine

## 2017-03-16 ENCOUNTER — Other Ambulatory Visit (HOSPITAL_COMMUNITY)
Admission: RE | Admit: 2017-03-16 | Discharge: 2017-03-16 | Disposition: A | Discharge status: Home / Self Care | Payer: 59 | Source: Ambulatory Visit | Attending: Family Medicine | Admitting: Family Medicine

## 2017-03-16 VITALS — BP 114/70 | HR 68 | Ht 64.0 in | Wt 109.0 lb

## 2017-03-16 DIAGNOSIS — K2960 Other gastritis without bleeding: Secondary | ICD-10-CM

## 2017-03-16 DIAGNOSIS — G4700 Insomnia, unspecified: Secondary | ICD-10-CM | POA: Diagnosis not present

## 2017-03-16 DIAGNOSIS — J301 Allergic rhinitis due to pollen: Secondary | ICD-10-CM | POA: Diagnosis not present

## 2017-03-16 DIAGNOSIS — Z0000 Encounter for general adult medical examination without abnormal findings: Secondary | ICD-10-CM | POA: Insufficient documentation

## 2017-03-16 DIAGNOSIS — J302 Other seasonal allergic rhinitis: Secondary | ICD-10-CM

## 2017-03-16 DIAGNOSIS — J3081 Allergic rhinitis due to animal (cat) (dog) hair and dander: Secondary | ICD-10-CM | POA: Diagnosis not present

## 2017-03-16 DIAGNOSIS — J3089 Other allergic rhinitis: Secondary | ICD-10-CM | POA: Diagnosis not present

## 2017-03-16 LAB — POCT URINALYSIS DIPSTICK
BILIRUBIN UA: NEGATIVE
GLUCOSE UA: NEGATIVE
Ketones, UA: NEGATIVE
Leukocytes, UA: NEGATIVE
Nitrite, UA: NEGATIVE
Protein, UA: NEGATIVE
RBC UA: NEGATIVE
UROBILINOGEN UA: NEGATIVE U/dL — AB
pH, UA: 6 (ref 5.0–8.0)

## 2017-03-16 NOTE — Patient Instructions (Signed)
  HEALTH MAINTENANCE RECOMMENDATIONS:  It is recommended that you get at least 30 minutes of aerobic exercise at least 5 days/week (for weight loss, you may need as much as 60-90 minutes). This can be any activity that gets your heart rate up. This can be divided in 10-15 minute intervals if needed, but try and build up your endurance at least once a week.  Weight bearing exercise is also recommended twice weekly.  Eat a healthy diet with lots of vegetables, fruits and fiber.  "Colorful" foods have a lot of vitamins (ie green vegetables, tomatoes, red peppers, etc).  Limit sweet tea, regular sodas and alcoholic beverages, all of which has a lot of calories and sugar.  Up to 1 alcoholic drink daily may be beneficial for women (unless trying to lose weight, watch sugars).  Drink a lot of water.  Calcium recommendations are 1200-1500 mg daily (1500 mg for postmenopausal women or women without ovaries), and vitamin D 1000 IU daily.  This should be obtained from diet and/or supplements (vitamins), and calcium should not be taken all at once, but in divided doses.  Monthly self breast exams and yearly mammograms for women over the age of 41 is recommended.  Sunscreen of at least SPF 30 should be used on all sun-exposed parts of the skin when outside between the hours of 10 am and 4 pm (not just when at beach or pool, but even with exercise, golf, tennis, and yard work!)  Use a sunscreen that says "broad spectrum" so it covers both UVA and UVB rays, and make sure to reapply every 1-2 hours.  Remember to change the batteries in your smoke detectors when changing your clock times in the spring and fall.  Use your seat belt every time you are in a car, and please drive safely and not be distracted with cell phones and texting while driving.  Continue your current vitamins. Go to the Travel clinic as planned.  Best of luck with your new, exciting adventures! Have Fun!

## 2017-03-17 ENCOUNTER — Other Ambulatory Visit: Payer: Self-pay | Admitting: Family Medicine

## 2017-03-17 DIAGNOSIS — K219 Gastro-esophageal reflux disease without esophagitis: Secondary | ICD-10-CM

## 2017-03-17 DIAGNOSIS — K2960 Other gastritis without bleeding: Secondary | ICD-10-CM

## 2017-03-17 MED FILL — PANTOPRAZOLE SOD DR 40 MG T: 40 | 90 days supply | Qty: 90 | Fill #0

## 2017-03-17 NOTE — Telephone Encounter (Signed)
Is this okay to refill? I seen in your notes from yesterday that pt was doing fine on this med without problems. Just wanted to make sure it was ok to refill

## 2017-03-18 LAB — CYTOLOGY - PAP
DIAGNOSIS: NEGATIVE
HPV: NOT DETECTED

## 2017-03-20 DIAGNOSIS — Z719 Counseling, unspecified: Secondary | ICD-10-CM | POA: Diagnosis not present

## 2017-03-20 DIAGNOSIS — Z23 Encounter for immunization: Secondary | ICD-10-CM | POA: Diagnosis not present

## 2017-03-26 DIAGNOSIS — J301 Allergic rhinitis due to pollen: Secondary | ICD-10-CM | POA: Diagnosis not present

## 2017-03-26 DIAGNOSIS — J3081 Allergic rhinitis due to animal (cat) (dog) hair and dander: Secondary | ICD-10-CM | POA: Diagnosis not present

## 2017-03-26 DIAGNOSIS — J3089 Other allergic rhinitis: Secondary | ICD-10-CM | POA: Diagnosis not present

## 2017-04-03 DIAGNOSIS — J301 Allergic rhinitis due to pollen: Secondary | ICD-10-CM | POA: Diagnosis not present

## 2017-04-03 DIAGNOSIS — J3081 Allergic rhinitis due to animal (cat) (dog) hair and dander: Secondary | ICD-10-CM | POA: Diagnosis not present

## 2017-04-03 DIAGNOSIS — J3089 Other allergic rhinitis: Secondary | ICD-10-CM | POA: Diagnosis not present

## 2017-04-08 DIAGNOSIS — Z760 Encounter for issue of repeat prescription: Secondary | ICD-10-CM | POA: Diagnosis not present

## 2017-04-13 DIAGNOSIS — J3089 Other allergic rhinitis: Secondary | ICD-10-CM | POA: Diagnosis not present

## 2017-04-13 DIAGNOSIS — J301 Allergic rhinitis due to pollen: Secondary | ICD-10-CM | POA: Diagnosis not present

## 2017-04-13 DIAGNOSIS — J3081 Allergic rhinitis due to animal (cat) (dog) hair and dander: Secondary | ICD-10-CM | POA: Diagnosis not present

## 2017-07-07 ENCOUNTER — Encounter: Payer: Self-pay | Admitting: Family Medicine

## 2017-07-09 ENCOUNTER — Other Ambulatory Visit: Payer: Self-pay | Admitting: *Deleted

## 2017-07-09 DIAGNOSIS — G4700 Insomnia, unspecified: Secondary | ICD-10-CM

## 2017-07-09 MED ORDER — ZOLPIDEM TARTRATE 10 MG PO TABS: ORAL_TABLET | ORAL | 0 refills | Status: DC

## 2017-07-09 MED FILL — ZOLPIDEM TARTRATE 10 MG TAB: 10 | 30 days supply | Qty: 30 | Fill #0

## 2017-11-04 ENCOUNTER — Encounter: Payer: Self-pay | Admitting: Family Medicine

## 2017-11-09 MED FILL — PANTOPRAZOLE SOD DR 40 MG T: 40 | 90 days supply | Qty: 90 | Fill #1

## 2018-06-05 ENCOUNTER — Encounter: Payer: Self-pay | Admitting: Family Medicine

## 2018-06-09 ENCOUNTER — Telehealth: Payer: Self-pay | Admitting: Family Medicine

## 2018-06-09 ENCOUNTER — Encounter (INDEPENDENT_AMBULATORY_CARE_PROVIDER_SITE_OTHER): Payer: Self-pay

## 2018-06-09 NOTE — Telephone Encounter (Signed)
Left message for pt to call. Dr. Lynelle DoctorKnapp has a cpe opening for the afternoon of 06/17/18. Dr. Lynelle DoctorKnapp would like to offer that appt to that pt . I have placed a hold for that day.

## 2018-06-15 NOTE — Progress Notes (Signed)
Chief Complaint  Patient presents with  . Annual Exam    fasting with annual exam with pap (started cycle this am). Has appt coming up. Needs refills and wants to discuss sleep issues.   . Influenza    will get through work.    Katherine Macias is a 42 y.o. female who presents for a complete physical.  She is requesting medication refills.  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  She denies any significant illnesses  H/o erosive gastritis, and has recurrent epigastric pain if she stops PPI. She has been taking Protonix daily without any problems. She has recurrent symptoms if she misses it for 2-3 days. Denies dysphagia or chest pain. She has some mild intermittent heartburn/reflux, and uses Tums prn (about once a month, triggered by spicy food, red wine).   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  Since back home, insomnia is better. She ran out, needs refill.  Used to use infrequently when home. Hasn't used melatonin in 2-3 months (used to help with shift changes).  Allergies: She used to take allergy shots, but had to stop when she was out of the country. Allergies didn't bother her at all, didn't require any allergy medication in [place], [place]. Since home, she is back on Zyrtec and flonase, and allergies are controlled.  Immunization History  Administered Date(s) Administered  . Influenza Split 08/02/2013  . Td 04/03/2004, 03/12/2015  . Tdap 05/29/2005   has gotten Hepatitis A vaccines (no copy of travel vaccines in her chart). gets flu shots yearly through work  Last Pap smear: 11/2013--candida; no high risk HPV Last mammogram: 02/2017, normal; scheduled for September Last colonoscopy: flex sig 07/2012 Last DEXA: n/a  Dentist: every 6 months  Ophtho: goes every 2 years,  scheduled Exercise: 4 days/week (running and biking, weights at least 2x/wk) since being home (just a few weeks). Still managed 3x/week even while away, even on ship. Lipids: Lab Results  Component Value Date   CHOL 156 11/07/2013   HDL 83 11/07/2013   LDLCALC 60 11/07/2013   TRIG 63 11/07/2013   CHOLHDL 1.9 11/07/2013    Vitamin D-OH level 41 03/2016 Had CBC, TSH, c-met and Mg 03/2016, all normal.   ROS: The patient denies anorexia, fever, weight changes, headaches, vision changes, decreased hearing, ear pain, sore throat, breast concerns, chest pain, palpitations, dizziness, syncope, dyspnea on exertion, cough, swelling, vomiting, diarrhea, constipation, melena, hematochezia, hematuria, incontinence, dysuria, irregular menstrual cycles, vaginal discharge, odor or itch, genital lesions, joint pains, numbness, tingling, weakness, tremor, suspicious skin lesions, depression, abnormal bleeding/bruising, or enlarged lymph nodes.  Insomnia (worse when out of the country, intermittent when home).    PHYSICAL EXAM:  BP 100/74   Pulse 72   Ht 5' 3.5" (1.613 m)   Wt 110 lb 12.8 oz (50.3 kg)   LMP 06/17/2018 (Exact Date)   BMI 19.32 kg/m    Wt Readings from Last 3 Encounters:  06/17/18 110 lb 12.8 oz (50.3 kg)  03/16/17 109 lb (49.4 kg)  03/12/16 107 lb (48.5 kg)    General Appearance:  Alert, cooperative, no distress, appears stated age   Head:  Normocephalic, without obvious abnormality, atraumatic   Eyes:  PERRL, conjunctiva/corneas clear, EOM's intact, fundi benign   Ears:  Normal TM's and external ear canals  Nose:  Nares normal, no drainage or sinus tenderness. Nasal mucosa is mildly edematous; no drainage or purulence  Throat:  Lips, mucosa, and tongue normal; teeth and gums normal   Neck:  Supple; thyroid: no enlargement/tenderness/nodules; no carotid bruit or JVD. No lymphadenopathy  Back:  Spine nontender, no curvature, ROM normal, no CVA  tenderness   Lungs:  Clear to auscultation bilaterally without wheezes, rales or ronchi; respirations unlabored   Chest Wall:  No tenderness or deformity   Heart:  Regular rate and rhythm, S1 and S2 normal, no murmur, rub or gallop   Breast Exam:  No tenderness, masses, or nipple discharge or inversion. No axillary lymphadenopathy   Abdomen:  Soft, non-tender, nondistended, normoactive bowel sounds, no masses, no hepatosplenomegaly   Genitalia:  Normal external genitalia without lesions. BUS and vagina normal; no cervical motion tenderness. Blood at os and in vaginal vault (on cycle). Slightly different appearance at os, scar vs very small recurrent polyp (but fairly flat). No abnormal vaginal discharge. Uterus and adnexa not enlarged, nontender, no masses. Pap performed.  Rectal:  Normal sphincter tone, no mass. Light brown stool, trace heme + (?contaminant from vaginal bleeding vs from known hemorrhoid, which was not palpable)  Extremities:  No clubbing, cyanosis or edema   Pulses:  2+ and symmetric all extremities   Skin:  Skin color, texture, turgor normal, no rashes or lesions   Lymph nodes:  Cervical, supraclavicular, and axillary nodes normal   Neurologic:  CNII-XII intact, normal strength, sensation and gait; reflexes 2+ and symmetric throughout    Psych: Normal mood, affect, hygiene and grooming    ASSESSMENT/PLAN:  Annual physical exam - Plan: POCT Urinalysis DIP (Proadvantage Device), Lipid panel, Comprehensive metabolic panel, CBC with Differential/Platelet, Cytology - PAP([facility])  Insomnia, unspecified type - intermittent (daily when out of the country over the past year); may continue prn use of ambien - Plan: zolpidem (AMBIEN) 10 MG tablet  Medication monitoring encounter - Plan: Comprehensive metabolic panel, CBC with Differential/Platelet  Gastroesophageal reflux disease, esophagitis presence not  specified - has recurrent epigastric pain when PPI stopped; discussed risks/supplements - Plan: pantoprazole (PROTONIX) 40 MG tablet  Erosive gastritis - continue PPI given recurrent pain when stopped; risks of longterm PPI's reviewed - Plan: pantoprazole (PROTONIX) 40 MG tablet   Get travel vaccines for chart  Discussed monthly self breast exams and yearly mammograms after the age of 37; at least 30 minutes of aerobic activity at least 5 days/week, weight-bearing exercise 2-3x/week; proper sunscreen use reviewed; healthy diet, including goals of calcium and vitamin D intake and alcohol recommendations (less than or equal to 1 drink/day) reviewed; regular seatbelt use; changing batteries in smoke detectors. Immunizations UTD, continue yearly flu shots. Colonoscopy age 65.  Continue long-term PPI, since needed, and continue supplements/vitamins.

## 2018-06-15 NOTE — Patient Instructions (Signed)

## 2018-06-16 ENCOUNTER — Other Ambulatory Visit: Payer: Self-pay | Admitting: Family Medicine

## 2018-06-16 DIAGNOSIS — Z1231 Encounter for screening mammogram for malignant neoplasm of breast: Secondary | ICD-10-CM

## 2018-06-17 ENCOUNTER — Ambulatory Visit (INDEPENDENT_AMBULATORY_CARE_PROVIDER_SITE_OTHER): Payer: 59 | Admitting: Family Medicine

## 2018-06-17 ENCOUNTER — Other Ambulatory Visit (HOSPITAL_COMMUNITY)
Admission: RE | Admit: 2018-06-17 | Discharge: 2018-06-17 | Disposition: A | Discharge status: Home / Self Care | Payer: 59 | Source: Ambulatory Visit | Attending: Family Medicine | Admitting: Family Medicine

## 2018-06-17 ENCOUNTER — Encounter: Payer: Self-pay | Admitting: Family Medicine

## 2018-06-17 VITALS — BP 100/74 | HR 72 | Ht 63.5 in | Wt 110.8 lb

## 2018-06-17 DIAGNOSIS — G4700 Insomnia, unspecified: Secondary | ICD-10-CM | POA: Diagnosis not present

## 2018-06-17 DIAGNOSIS — K219 Gastro-esophageal reflux disease without esophagitis: Secondary | ICD-10-CM | POA: Diagnosis not present

## 2018-06-17 DIAGNOSIS — K2960 Other gastritis without bleeding: Secondary | ICD-10-CM | POA: Diagnosis not present

## 2018-06-17 DIAGNOSIS — Z0000 Encounter for general adult medical examination without abnormal findings: Secondary | ICD-10-CM | POA: Insufficient documentation

## 2018-06-17 DIAGNOSIS — Z5181 Encounter for therapeutic drug level monitoring: Secondary | ICD-10-CM | POA: Diagnosis not present

## 2018-06-17 LAB — POCT URINALYSIS DIP (PROADVANTAGE DEVICE)
Bilirubin, UA: NEGATIVE
GLUCOSE UA: NEGATIVE mg/dL
Leukocytes, UA: NEGATIVE
Nitrite, UA: NEGATIVE
Protein Ur, POC: NEGATIVE mg/dL
SPECIFIC GRAVITY, URINE: 1.01
UUROB: NEGATIVE
pH, UA: 6 (ref 5.0–8.0)

## 2018-06-17 LAB — RESULTS CONSOLE HPV: CHL HPV: NEGATIVE

## 2018-06-17 LAB — HM PAP SMEAR: HM Pap smear: NEGATIVE

## 2018-06-17 MED ORDER — ZOLPIDEM TARTRATE 10 MG PO TABS: ORAL_TABLET | ORAL | 0 refills | Status: DC

## 2018-06-17 MED ORDER — PANTOPRAZOLE SODIUM 40 MG PO TBEC: 40.0000 mg | DELAYED_RELEASE_TABLET | Freq: Every day | ORAL | 3 refills | Status: DC

## 2018-06-17 MED FILL — PANTOPRAZOLE SOD DR 40 MG T: 40 | 90 days supply | Qty: 90 | Fill #0

## 2018-06-17 MED FILL — ZOLPIDEM TARTRATE 10 MG TAB: 10 | 30 days supply | Qty: 30 | Fill #0

## 2018-06-18 LAB — COMPREHENSIVE METABOLIC PANEL
ALBUMIN: 4.4 g/dL (ref 3.5–5.5)
ALK PHOS: 51 IU/L (ref 39–117)
ALT: 15 IU/L (ref 0–32)
AST: 26 IU/L (ref 0–40)
Albumin/Globulin Ratio: 1.7 (ref 1.2–2.2)
BUN / CREAT RATIO: 18 (ref 9–23)
BUN: 17 mg/dL (ref 6–24)
Bilirubin Total: 0.5 mg/dL (ref 0.0–1.2)
CO2: 21 mmol/L (ref 20–29)
CREATININE: 0.95 mg/dL (ref 0.57–1.00)
Calcium: 9.6 mg/dL (ref 8.7–10.2)
Chloride: 99 mmol/L (ref 96–106)
GFR calc Af Amer: 86 mL/min/{1.73_m2} (ref >59)
GFR calc non Af Amer: 75 mL/min/{1.73_m2} (ref >59)
GLUCOSE: 60 mg/dL — AB (ref 65–99)
Globulin, Total: 2.6 g/dL (ref 1.5–4.5)
Potassium: 4.9 mmol/L (ref 3.5–5.2)
Sodium: 136 mmol/L (ref 134–144)
Total Protein: 7 g/dL (ref 6.0–8.5)

## 2018-06-18 LAB — CBC WITH DIFFERENTIAL/PLATELET
Basophils Absolute: 0 10*3/uL (ref 0.0–0.2)
Basos: 0 %
EOS (ABSOLUTE): 0 10*3/uL (ref 0.0–0.4)
EOS: 0 %
HEMATOCRIT: 38.8 % (ref 34.0–46.6)
HEMOGLOBIN: 12.4 g/dL (ref 11.1–15.9)
IMMATURE GRANULOCYTES: 0 %
Immature Grans (Abs): 0 10*3/uL (ref 0.0–0.1)
LYMPHS: 11 %
Lymphocytes Absolute: 1.6 10*3/uL (ref 0.7–3.1)
MCH: 27.4 pg (ref 26.6–33.0)
MCHC: 32 g/dL (ref 31.5–35.7)
MCV: 86 fL (ref 79–97)
MONOCYTES: 5 %
Monocytes Absolute: 0.7 10*3/uL (ref 0.1–0.9)
NEUTROS PCT: 84 %
Neutrophils Absolute: 12.5 10*3/uL — ABNORMAL HIGH (ref 1.4–7.0)
Platelets: 325 10*3/uL (ref 150–450)
RBC: 4.52 x10E6/uL (ref 3.77–5.28)
RDW: 15.5 % — ABNORMAL HIGH (ref 12.3–15.4)
WBC: 14.9 10*3/uL — AB (ref 3.4–10.8)

## 2018-06-18 LAB — LIPID PANEL
CHOLESTEROL TOTAL: 174 mg/dL (ref 100–199)
Chol/HDL Ratio: 1.9 ratio (ref 0.0–4.4)
HDL: 94 mg/dL (ref >39)
LDL CALC: 72 mg/dL (ref 0–99)
Triglycerides: 38 mg/dL (ref 0–149)
VLDL Cholesterol Cal: 8 mg/dL (ref 5–40)

## 2018-06-22 LAB — CYTOLOGY - PAP
DIAGNOSIS: NEGATIVE
HPV: NOT DETECTED

## 2018-08-02 ENCOUNTER — Ambulatory Visit
Admission: RE | Admit: 2018-08-02 | Discharge: 2018-08-02 | Disposition: A | Discharge status: Home / Self Care | Payer: 59 | Source: Ambulatory Visit | Attending: Family Medicine | Admitting: Family Medicine

## 2018-08-02 DIAGNOSIS — Z1231 Encounter for screening mammogram for malignant neoplasm of breast: Secondary | ICD-10-CM | POA: Diagnosis not present

## 2018-08-03 ENCOUNTER — Other Ambulatory Visit: Payer: Self-pay | Admitting: Family Medicine

## 2018-08-03 DIAGNOSIS — R928 Other abnormal and inconclusive findings on diagnostic imaging of breast: Secondary | ICD-10-CM

## 2018-08-05 ENCOUNTER — Other Ambulatory Visit: Payer: Self-pay | Admitting: Family Medicine

## 2018-08-05 ENCOUNTER — Ambulatory Visit
Admission: RE | Admit: 2018-08-05 | Discharge: 2018-08-05 | Disposition: A | Discharge status: Home / Self Care | Payer: 59 | Source: Ambulatory Visit | Attending: Family Medicine | Admitting: Family Medicine

## 2018-08-05 DIAGNOSIS — R921 Mammographic calcification found on diagnostic imaging of breast: Secondary | ICD-10-CM

## 2018-08-05 DIAGNOSIS — R928 Other abnormal and inconclusive findings on diagnostic imaging of breast: Secondary | ICD-10-CM

## 2018-09-03 DIAGNOSIS — H52223 Regular astigmatism, bilateral: Secondary | ICD-10-CM | POA: Diagnosis not present

## 2018-09-24 MED FILL — PANTOPRAZOLE SOD DR 40 MG T: 40 | 90 days supply | Qty: 90 | Fill #1

## 2018-10-08 IMAGING — MG DIGITAL SCREENING BILATERAL MAMMOGRAM WITH TOMO AND CAD
8 series · 9 of 24 positions shown · non-contrast
Comparison: Previous exam(s).

CLINICAL DATA: Screening.

EXAM:
DIGITAL SCREENING BILATERAL MAMMOGRAM WITH TOMO AND CAD

[R MLO synth-2D]
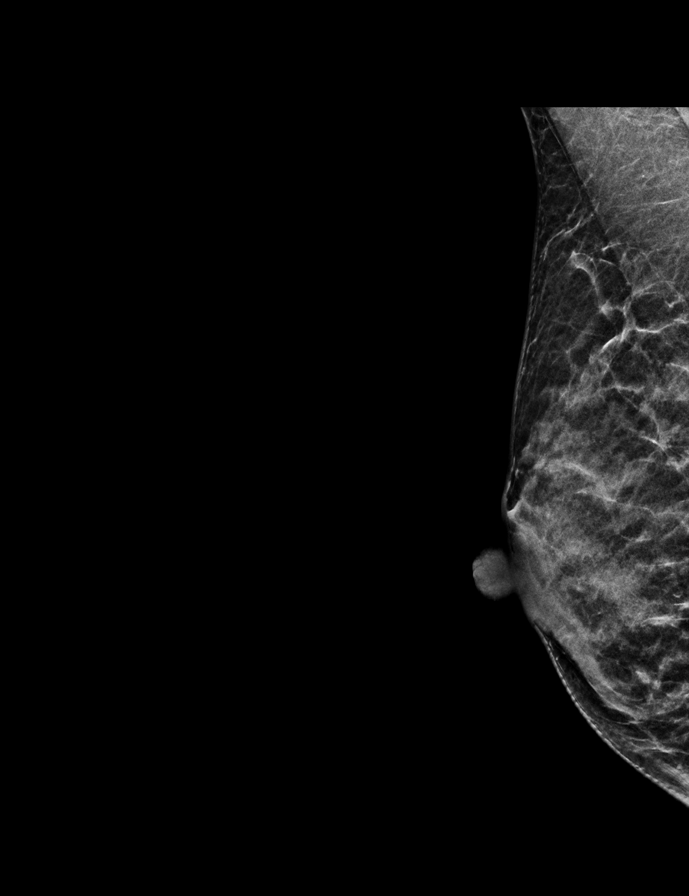

[R CC synth-2D]
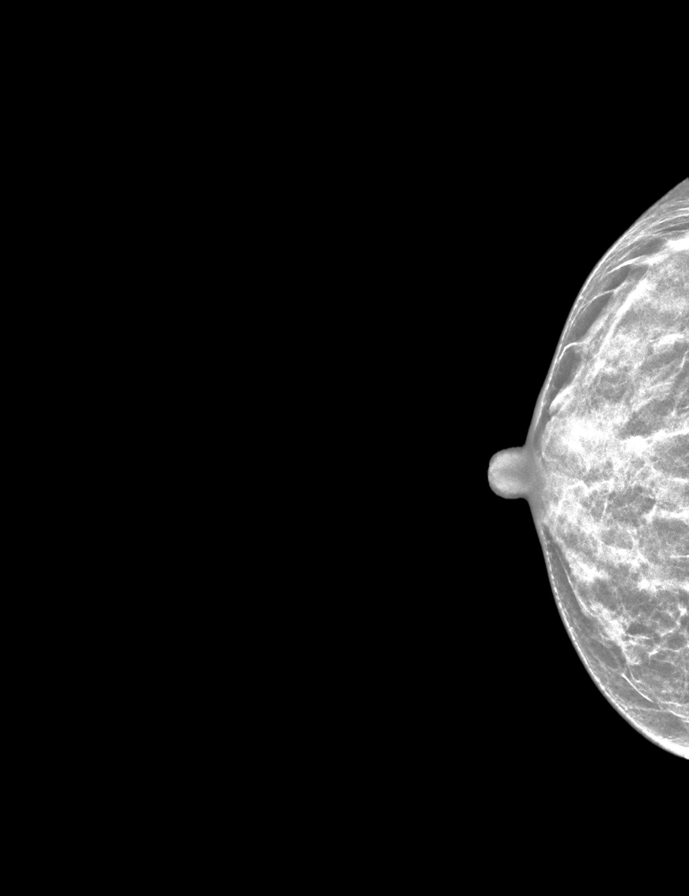

[L MLO synth-2D]
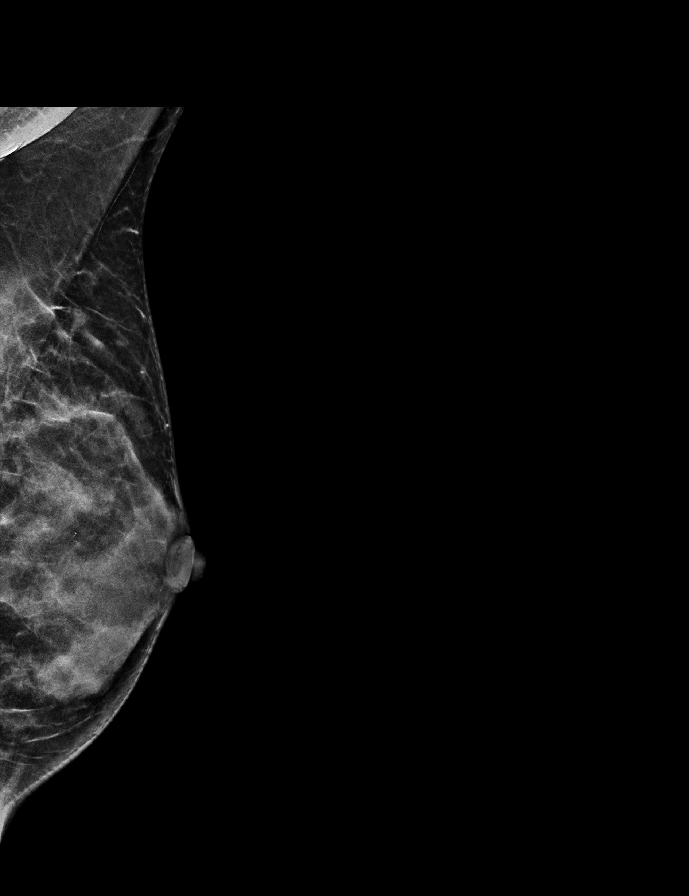

[L CC synth-2D]
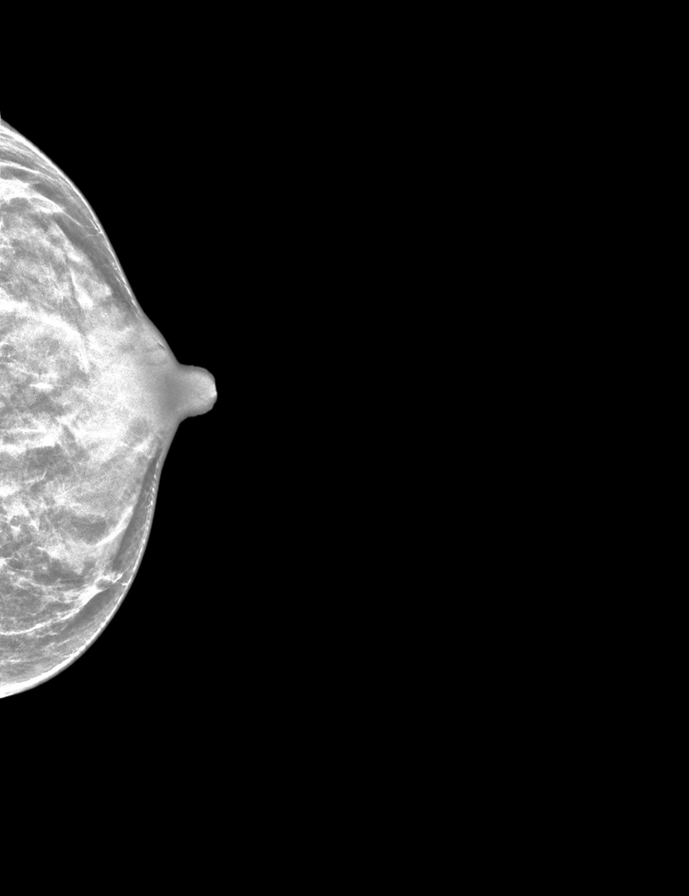

[L CC tomo · 2 of 38 frames shown]
[frame 13/38]
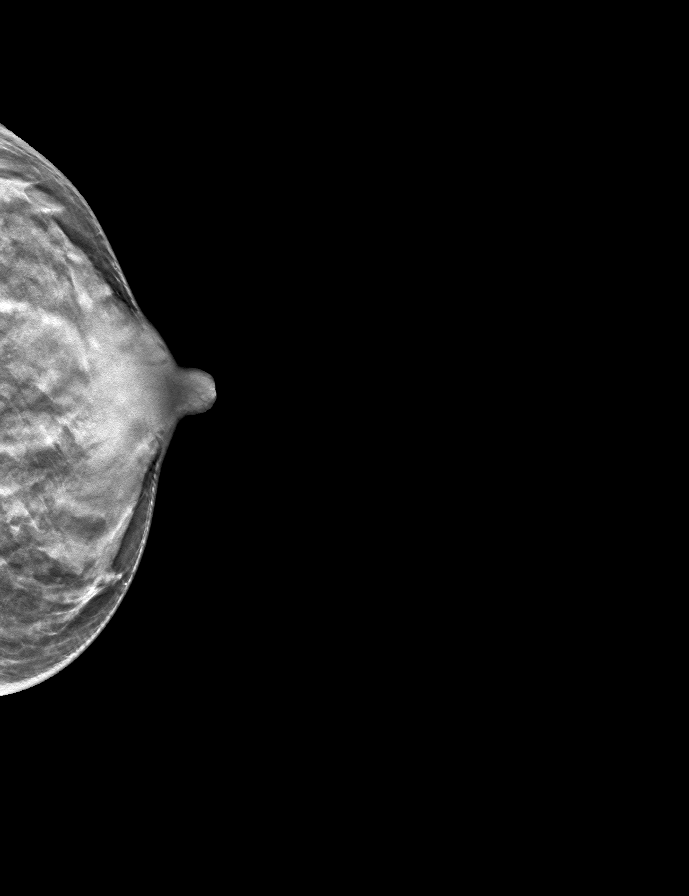
[frame 19/38]
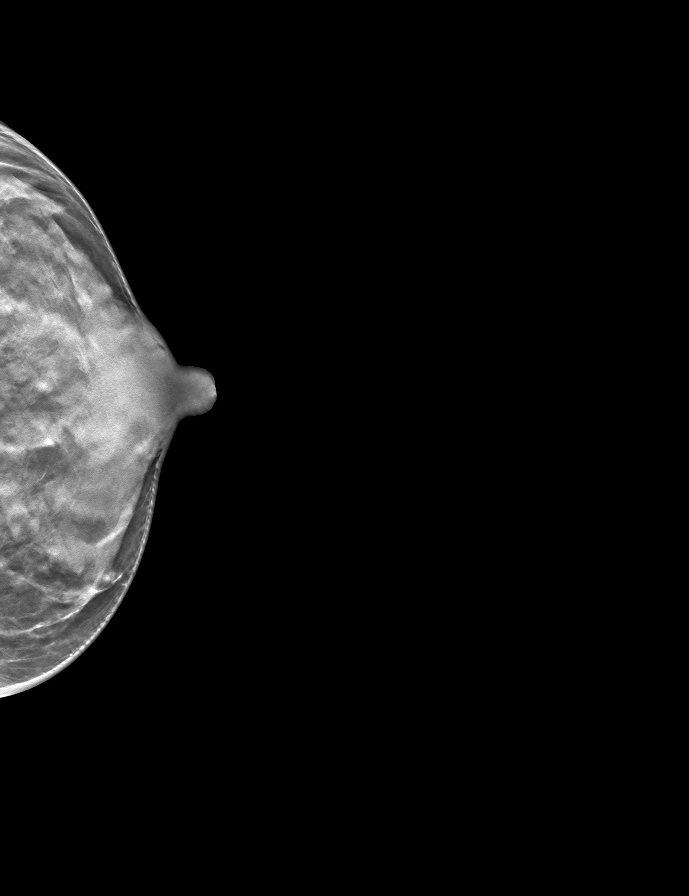

[R MLO tomo · tomo slice 17/34.0]
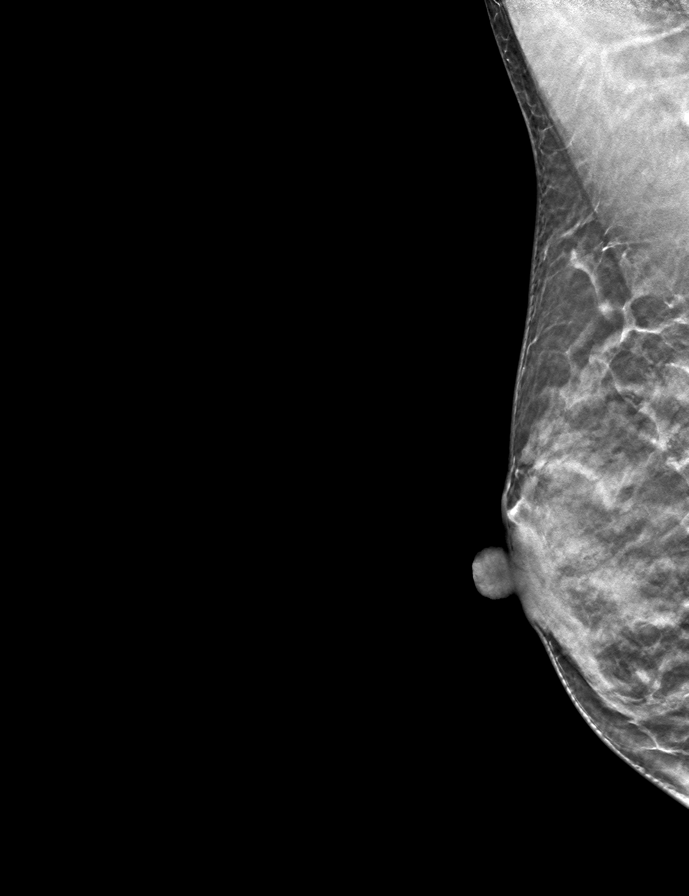

[R CC tomo · tomo slice 17/34.0]
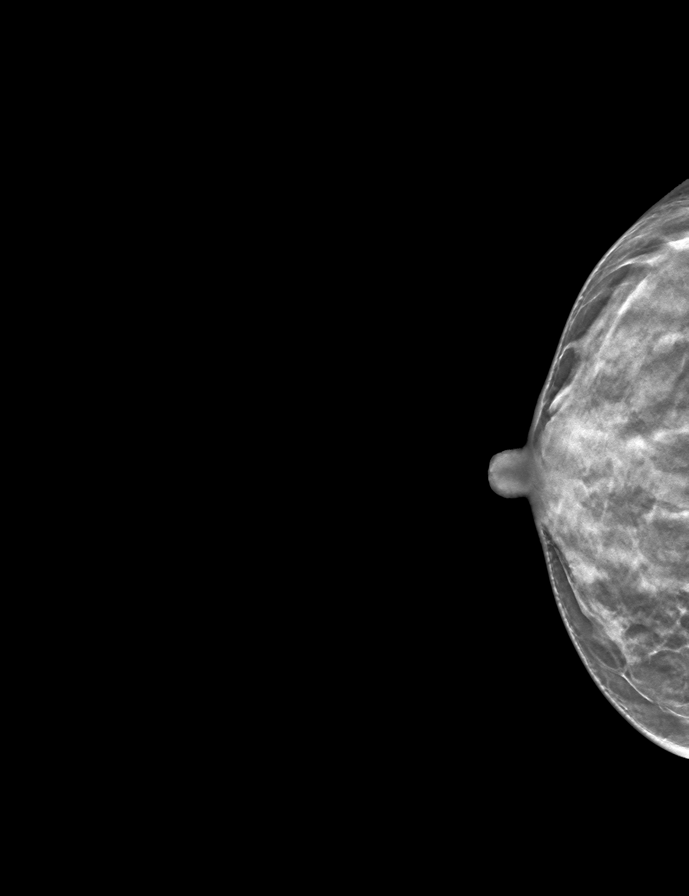

[L MLO tomo · tomo slice 23/45.0]
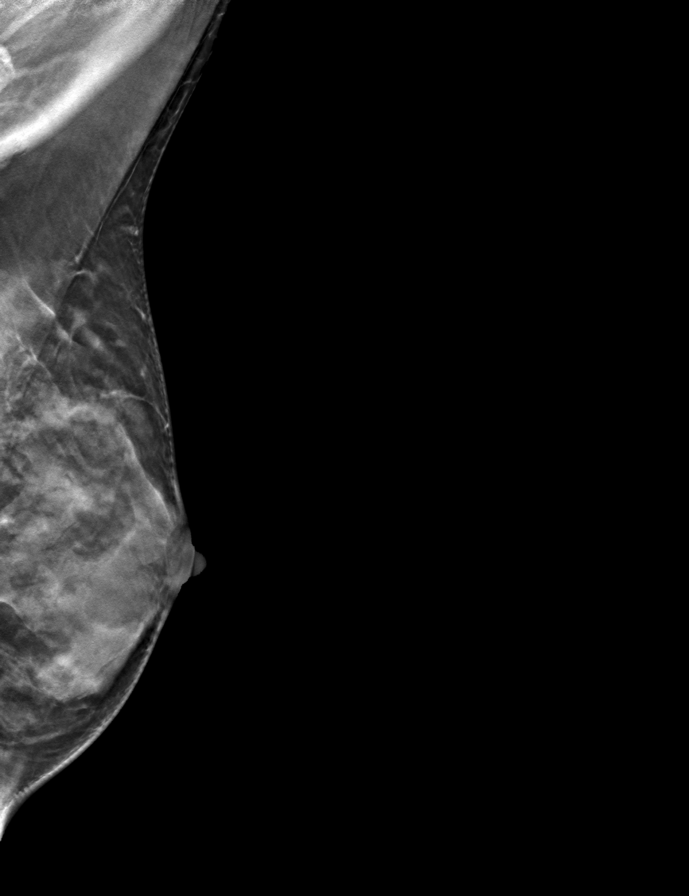

[9 of 24 positions shown; findings below may reference images not displayed]

ACR Breast Density Category d: The breast tissue is extremely dense,
which lowers the sensitivity of mammography.
FINDINGS: In the left breast, calcifications warrant further evaluation. In
the right breast, no findings suspicious for malignancy. Images were
processed with CAD.
IMPRESSION: Further evaluation is suggested for calcifications in the left
breast.

RECOMMENDATION:
Diagnostic mammogram of the left breast. (Code:H0-3-666)

The patient will be contacted regarding the findings, and additional
imaging will be scheduled.

BI-RADS CATEGORY  0: Incomplete. Need additional imaging evaluation
and/or prior mammograms for comparison.

## 2018-11-08 DIAGNOSIS — J3081 Allergic rhinitis due to animal (cat) (dog) hair and dander: Secondary | ICD-10-CM | POA: Diagnosis not present

## 2018-11-08 DIAGNOSIS — J3089 Other allergic rhinitis: Secondary | ICD-10-CM | POA: Diagnosis not present

## 2018-11-08 DIAGNOSIS — J301 Allergic rhinitis due to pollen: Secondary | ICD-10-CM | POA: Diagnosis not present

## 2018-11-08 MED FILL — LEVOCETIRIZINE 5 MG TABLET: 5 | 30 days supply | Qty: 30 | Fill #0

## 2018-11-08 MED FILL — AZELASTINE HCL 137 MCG SPRY: 0.1 | 50 days supply | Qty: 30 | Fill #0

## 2018-11-08 MED FILL — EPINEPHRINE 0.3 MG AUTO-INJ: 0.3 | 30 days supply | Qty: 2 | Fill #0

## 2018-11-12 ENCOUNTER — Encounter: Payer: Self-pay | Admitting: Family Medicine

## 2018-11-12 DIAGNOSIS — G4700 Insomnia, unspecified: Secondary | ICD-10-CM

## 2018-11-12 MED ORDER — ZOLPIDEM TARTRATE 10 MG PO TABS: ORAL_TABLET | ORAL | 1 refills | Status: DC

## 2018-11-12 MED FILL — ZOLPIDEM TARTRATE 10 MG TAB: 10 | 30 days supply | Qty: 30 | Fill #0

## 2018-11-19 DIAGNOSIS — J301 Allergic rhinitis due to pollen: Secondary | ICD-10-CM | POA: Diagnosis not present

## 2018-11-22 DIAGNOSIS — J3089 Other allergic rhinitis: Secondary | ICD-10-CM | POA: Diagnosis not present

## 2018-11-22 DIAGNOSIS — J3081 Allergic rhinitis due to animal (cat) (dog) hair and dander: Secondary | ICD-10-CM | POA: Diagnosis not present

## 2018-11-26 DIAGNOSIS — J3081 Allergic rhinitis due to animal (cat) (dog) hair and dander: Secondary | ICD-10-CM | POA: Diagnosis not present

## 2018-11-26 DIAGNOSIS — J301 Allergic rhinitis due to pollen: Secondary | ICD-10-CM | POA: Diagnosis not present

## 2018-11-26 DIAGNOSIS — J3089 Other allergic rhinitis: Secondary | ICD-10-CM | POA: Diagnosis not present

## 2018-11-29 DIAGNOSIS — J3081 Allergic rhinitis due to animal (cat) (dog) hair and dander: Secondary | ICD-10-CM | POA: Diagnosis not present

## 2018-11-29 DIAGNOSIS — J301 Allergic rhinitis due to pollen: Secondary | ICD-10-CM | POA: Diagnosis not present

## 2018-11-29 DIAGNOSIS — J3089 Other allergic rhinitis: Secondary | ICD-10-CM | POA: Diagnosis not present

## 2018-12-01 DIAGNOSIS — J301 Allergic rhinitis due to pollen: Secondary | ICD-10-CM | POA: Diagnosis not present

## 2018-12-01 DIAGNOSIS — J3081 Allergic rhinitis due to animal (cat) (dog) hair and dander: Secondary | ICD-10-CM | POA: Diagnosis not present

## 2018-12-01 DIAGNOSIS — J3089 Other allergic rhinitis: Secondary | ICD-10-CM | POA: Diagnosis not present

## 2018-12-03 ENCOUNTER — Encounter: Payer: Self-pay | Admitting: Family Medicine

## 2018-12-03 DIAGNOSIS — J3089 Other allergic rhinitis: Secondary | ICD-10-CM | POA: Diagnosis not present

## 2018-12-03 DIAGNOSIS — J3081 Allergic rhinitis due to animal (cat) (dog) hair and dander: Secondary | ICD-10-CM | POA: Diagnosis not present

## 2018-12-03 DIAGNOSIS — J301 Allergic rhinitis due to pollen: Secondary | ICD-10-CM | POA: Diagnosis not present

## 2018-12-06 DIAGNOSIS — J301 Allergic rhinitis due to pollen: Secondary | ICD-10-CM | POA: Diagnosis not present

## 2018-12-06 DIAGNOSIS — J3081 Allergic rhinitis due to animal (cat) (dog) hair and dander: Secondary | ICD-10-CM | POA: Diagnosis not present

## 2018-12-06 DIAGNOSIS — J3089 Other allergic rhinitis: Secondary | ICD-10-CM | POA: Diagnosis not present

## 2018-12-08 DIAGNOSIS — J3089 Other allergic rhinitis: Secondary | ICD-10-CM | POA: Diagnosis not present

## 2018-12-08 DIAGNOSIS — J301 Allergic rhinitis due to pollen: Secondary | ICD-10-CM | POA: Diagnosis not present

## 2018-12-08 DIAGNOSIS — J3081 Allergic rhinitis due to animal (cat) (dog) hair and dander: Secondary | ICD-10-CM | POA: Diagnosis not present

## 2018-12-10 DIAGNOSIS — J3081 Allergic rhinitis due to animal (cat) (dog) hair and dander: Secondary | ICD-10-CM | POA: Diagnosis not present

## 2018-12-10 DIAGNOSIS — J301 Allergic rhinitis due to pollen: Secondary | ICD-10-CM | POA: Diagnosis not present

## 2018-12-10 DIAGNOSIS — J3089 Other allergic rhinitis: Secondary | ICD-10-CM | POA: Diagnosis not present

## 2018-12-10 MED FILL — LEVOCETIRIZINE 5 MG TABLET: 5 | 30 days supply | Qty: 30 | Fill #0

## 2018-12-13 DIAGNOSIS — J3081 Allergic rhinitis due to animal (cat) (dog) hair and dander: Secondary | ICD-10-CM | POA: Diagnosis not present

## 2018-12-13 DIAGNOSIS — J301 Allergic rhinitis due to pollen: Secondary | ICD-10-CM | POA: Diagnosis not present

## 2018-12-13 DIAGNOSIS — J3089 Other allergic rhinitis: Secondary | ICD-10-CM | POA: Diagnosis not present

## 2018-12-17 DIAGNOSIS — J3081 Allergic rhinitis due to animal (cat) (dog) hair and dander: Secondary | ICD-10-CM | POA: Diagnosis not present

## 2018-12-17 DIAGNOSIS — J301 Allergic rhinitis due to pollen: Secondary | ICD-10-CM | POA: Diagnosis not present

## 2018-12-17 DIAGNOSIS — J3089 Other allergic rhinitis: Secondary | ICD-10-CM | POA: Diagnosis not present

## 2018-12-20 DIAGNOSIS — J3081 Allergic rhinitis due to animal (cat) (dog) hair and dander: Secondary | ICD-10-CM | POA: Diagnosis not present

## 2018-12-20 DIAGNOSIS — J301 Allergic rhinitis due to pollen: Secondary | ICD-10-CM | POA: Diagnosis not present

## 2018-12-20 DIAGNOSIS — J3089 Other allergic rhinitis: Secondary | ICD-10-CM | POA: Diagnosis not present

## 2018-12-20 MED FILL — PANTOPRAZOLE SOD DR 40 MG T: 40 | 90 days supply | Qty: 90 | Fill #2

## 2018-12-23 DIAGNOSIS — J3081 Allergic rhinitis due to animal (cat) (dog) hair and dander: Secondary | ICD-10-CM | POA: Diagnosis not present

## 2018-12-23 DIAGNOSIS — J3089 Other allergic rhinitis: Secondary | ICD-10-CM | POA: Diagnosis not present

## 2018-12-23 DIAGNOSIS — J301 Allergic rhinitis due to pollen: Secondary | ICD-10-CM | POA: Diagnosis not present

## 2019-01-11 ENCOUNTER — Ambulatory Visit: Payer: 59 | Admitting: Medical

## 2019-01-13 MED FILL — LEVOCETIRIZINE 5 MG TABLET: 5 | 30 days supply | Qty: 30 | Fill #1 | Status: TO

## 2019-01-14 ENCOUNTER — Ambulatory Visit: Payer: 59 | Admitting: Medical

## 2019-01-14 DIAGNOSIS — J301 Allergic rhinitis due to pollen: Secondary | ICD-10-CM | POA: Diagnosis not present

## 2019-01-14 DIAGNOSIS — J3089 Other allergic rhinitis: Secondary | ICD-10-CM | POA: Diagnosis not present

## 2019-01-14 DIAGNOSIS — J3081 Allergic rhinitis due to animal (cat) (dog) hair and dander: Secondary | ICD-10-CM | POA: Diagnosis not present

## 2019-01-17 DIAGNOSIS — J3081 Allergic rhinitis due to animal (cat) (dog) hair and dander: Secondary | ICD-10-CM | POA: Diagnosis not present

## 2019-01-17 DIAGNOSIS — J301 Allergic rhinitis due to pollen: Secondary | ICD-10-CM | POA: Diagnosis not present

## 2019-01-17 DIAGNOSIS — J3089 Other allergic rhinitis: Secondary | ICD-10-CM | POA: Diagnosis not present

## 2019-01-19 MED FILL — FLUTICASONE PROP 50 MCG SPR: 50 | 90 days supply | Qty: 48 | Fill #0

## 2019-01-20 DIAGNOSIS — J3089 Other allergic rhinitis: Secondary | ICD-10-CM | POA: Diagnosis not present

## 2019-01-20 DIAGNOSIS — J301 Allergic rhinitis due to pollen: Secondary | ICD-10-CM | POA: Diagnosis not present

## 2019-01-20 DIAGNOSIS — J3081 Allergic rhinitis due to animal (cat) (dog) hair and dander: Secondary | ICD-10-CM | POA: Diagnosis not present

## 2019-01-24 DIAGNOSIS — J3081 Allergic rhinitis due to animal (cat) (dog) hair and dander: Secondary | ICD-10-CM | POA: Diagnosis not present

## 2019-01-24 DIAGNOSIS — J3089 Other allergic rhinitis: Secondary | ICD-10-CM | POA: Diagnosis not present

## 2019-01-24 DIAGNOSIS — J301 Allergic rhinitis due to pollen: Secondary | ICD-10-CM | POA: Diagnosis not present

## 2019-01-27 DIAGNOSIS — J3081 Allergic rhinitis due to animal (cat) (dog) hair and dander: Secondary | ICD-10-CM | POA: Diagnosis not present

## 2019-01-27 DIAGNOSIS — J301 Allergic rhinitis due to pollen: Secondary | ICD-10-CM | POA: Diagnosis not present

## 2019-01-27 DIAGNOSIS — J3089 Other allergic rhinitis: Secondary | ICD-10-CM | POA: Diagnosis not present

## 2019-01-31 DIAGNOSIS — J3089 Other allergic rhinitis: Secondary | ICD-10-CM | POA: Diagnosis not present

## 2019-01-31 DIAGNOSIS — J3081 Allergic rhinitis due to animal (cat) (dog) hair and dander: Secondary | ICD-10-CM | POA: Diagnosis not present

## 2019-01-31 DIAGNOSIS — J301 Allergic rhinitis due to pollen: Secondary | ICD-10-CM | POA: Diagnosis not present

## 2019-01-31 MED FILL — LEVOCETIRIZINE 5 MG TABLET: 5 | 90 days supply | Qty: 90 | Fill #0

## 2019-02-03 DIAGNOSIS — J3089 Other allergic rhinitis: Secondary | ICD-10-CM | POA: Diagnosis not present

## 2019-02-03 DIAGNOSIS — J301 Allergic rhinitis due to pollen: Secondary | ICD-10-CM | POA: Diagnosis not present

## 2019-02-03 DIAGNOSIS — J3081 Allergic rhinitis due to animal (cat) (dog) hair and dander: Secondary | ICD-10-CM | POA: Diagnosis not present

## 2019-02-04 ENCOUNTER — Ambulatory Visit
Admission: RE | Admit: 2019-02-04 | Discharge: 2019-02-04 | Disposition: A | Discharge status: Home / Self Care | Payer: 59 | Source: Ambulatory Visit | Attending: Family Medicine | Admitting: Family Medicine

## 2019-02-04 ENCOUNTER — Other Ambulatory Visit: Payer: Self-pay

## 2019-02-04 ENCOUNTER — Other Ambulatory Visit: Payer: Self-pay | Admitting: Family Medicine

## 2019-02-04 DIAGNOSIS — R921 Mammographic calcification found on diagnostic imaging of breast: Secondary | ICD-10-CM

## 2019-02-07 DIAGNOSIS — J301 Allergic rhinitis due to pollen: Secondary | ICD-10-CM | POA: Diagnosis not present

## 2019-02-07 DIAGNOSIS — J3089 Other allergic rhinitis: Secondary | ICD-10-CM | POA: Diagnosis not present

## 2019-02-07 DIAGNOSIS — J3081 Allergic rhinitis due to animal (cat) (dog) hair and dander: Secondary | ICD-10-CM | POA: Diagnosis not present

## 2019-02-10 DIAGNOSIS — J3081 Allergic rhinitis due to animal (cat) (dog) hair and dander: Secondary | ICD-10-CM | POA: Diagnosis not present

## 2019-02-10 DIAGNOSIS — J301 Allergic rhinitis due to pollen: Secondary | ICD-10-CM | POA: Diagnosis not present

## 2019-02-10 DIAGNOSIS — J3089 Other allergic rhinitis: Secondary | ICD-10-CM | POA: Diagnosis not present

## 2019-02-15 DIAGNOSIS — J3089 Other allergic rhinitis: Secondary | ICD-10-CM | POA: Diagnosis not present

## 2019-02-15 DIAGNOSIS — J3081 Allergic rhinitis due to animal (cat) (dog) hair and dander: Secondary | ICD-10-CM | POA: Diagnosis not present

## 2019-02-15 DIAGNOSIS — J301 Allergic rhinitis due to pollen: Secondary | ICD-10-CM | POA: Diagnosis not present

## 2019-02-18 DIAGNOSIS — J301 Allergic rhinitis due to pollen: Secondary | ICD-10-CM | POA: Diagnosis not present

## 2019-02-18 DIAGNOSIS — J3081 Allergic rhinitis due to animal (cat) (dog) hair and dander: Secondary | ICD-10-CM | POA: Diagnosis not present

## 2019-02-18 DIAGNOSIS — J3089 Other allergic rhinitis: Secondary | ICD-10-CM | POA: Diagnosis not present

## 2019-02-23 DIAGNOSIS — J301 Allergic rhinitis due to pollen: Secondary | ICD-10-CM | POA: Diagnosis not present

## 2019-02-23 DIAGNOSIS — J3081 Allergic rhinitis due to animal (cat) (dog) hair and dander: Secondary | ICD-10-CM | POA: Diagnosis not present

## 2019-02-23 DIAGNOSIS — J3089 Other allergic rhinitis: Secondary | ICD-10-CM | POA: Diagnosis not present

## 2019-03-02 DIAGNOSIS — J301 Allergic rhinitis due to pollen: Secondary | ICD-10-CM | POA: Diagnosis not present

## 2019-03-02 DIAGNOSIS — J3081 Allergic rhinitis due to animal (cat) (dog) hair and dander: Secondary | ICD-10-CM | POA: Diagnosis not present

## 2019-03-02 DIAGNOSIS — J3089 Other allergic rhinitis: Secondary | ICD-10-CM | POA: Diagnosis not present

## 2019-03-08 DIAGNOSIS — J301 Allergic rhinitis due to pollen: Secondary | ICD-10-CM | POA: Diagnosis not present

## 2019-03-08 DIAGNOSIS — J3089 Other allergic rhinitis: Secondary | ICD-10-CM | POA: Diagnosis not present

## 2019-03-08 DIAGNOSIS — J3081 Allergic rhinitis due to animal (cat) (dog) hair and dander: Secondary | ICD-10-CM | POA: Diagnosis not present

## 2019-03-15 DIAGNOSIS — J3081 Allergic rhinitis due to animal (cat) (dog) hair and dander: Secondary | ICD-10-CM | POA: Diagnosis not present

## 2019-03-15 DIAGNOSIS — J301 Allergic rhinitis due to pollen: Secondary | ICD-10-CM | POA: Diagnosis not present

## 2019-03-15 DIAGNOSIS — J3089 Other allergic rhinitis: Secondary | ICD-10-CM | POA: Diagnosis not present

## 2019-03-24 DIAGNOSIS — J3089 Other allergic rhinitis: Secondary | ICD-10-CM | POA: Diagnosis not present

## 2019-03-24 DIAGNOSIS — J3081 Allergic rhinitis due to animal (cat) (dog) hair and dander: Secondary | ICD-10-CM | POA: Diagnosis not present

## 2019-03-24 DIAGNOSIS — J301 Allergic rhinitis due to pollen: Secondary | ICD-10-CM | POA: Diagnosis not present

## 2019-03-29 DIAGNOSIS — J3089 Other allergic rhinitis: Secondary | ICD-10-CM | POA: Diagnosis not present

## 2019-03-29 DIAGNOSIS — J3081 Allergic rhinitis due to animal (cat) (dog) hair and dander: Secondary | ICD-10-CM | POA: Diagnosis not present

## 2019-03-29 DIAGNOSIS — J301 Allergic rhinitis due to pollen: Secondary | ICD-10-CM | POA: Diagnosis not present

## 2019-03-30 DIAGNOSIS — J3089 Other allergic rhinitis: Secondary | ICD-10-CM | POA: Diagnosis not present

## 2019-03-30 DIAGNOSIS — J3081 Allergic rhinitis due to animal (cat) (dog) hair and dander: Secondary | ICD-10-CM | POA: Diagnosis not present

## 2019-04-05 DIAGNOSIS — J3089 Other allergic rhinitis: Secondary | ICD-10-CM | POA: Diagnosis not present

## 2019-04-05 DIAGNOSIS — J301 Allergic rhinitis due to pollen: Secondary | ICD-10-CM | POA: Diagnosis not present

## 2019-04-05 DIAGNOSIS — J3081 Allergic rhinitis due to animal (cat) (dog) hair and dander: Secondary | ICD-10-CM | POA: Diagnosis not present

## 2019-04-08 MED FILL — PANTOPRAZOLE SOD DR 40 MG T: 40 | 90 days supply | Qty: 90 | Fill #3

## 2019-04-08 MED FILL — AZELASTINE HCL 137 MCG SPRY: 0.1 | 50 days supply | Qty: 30 | Fill #1

## 2019-04-14 DIAGNOSIS — J301 Allergic rhinitis due to pollen: Secondary | ICD-10-CM | POA: Diagnosis not present

## 2019-04-14 DIAGNOSIS — J3089 Other allergic rhinitis: Secondary | ICD-10-CM | POA: Diagnosis not present

## 2019-04-14 DIAGNOSIS — J3081 Allergic rhinitis due to animal (cat) (dog) hair and dander: Secondary | ICD-10-CM | POA: Diagnosis not present

## 2019-04-21 DIAGNOSIS — J3089 Other allergic rhinitis: Secondary | ICD-10-CM | POA: Diagnosis not present

## 2019-04-21 DIAGNOSIS — J3081 Allergic rhinitis due to animal (cat) (dog) hair and dander: Secondary | ICD-10-CM | POA: Diagnosis not present

## 2019-04-21 DIAGNOSIS — J301 Allergic rhinitis due to pollen: Secondary | ICD-10-CM | POA: Diagnosis not present

## 2019-04-29 DIAGNOSIS — J301 Allergic rhinitis due to pollen: Secondary | ICD-10-CM | POA: Diagnosis not present

## 2019-04-29 DIAGNOSIS — J3089 Other allergic rhinitis: Secondary | ICD-10-CM | POA: Diagnosis not present

## 2019-04-29 DIAGNOSIS — J3081 Allergic rhinitis due to animal (cat) (dog) hair and dander: Secondary | ICD-10-CM | POA: Diagnosis not present

## 2019-05-04 DIAGNOSIS — J301 Allergic rhinitis due to pollen: Secondary | ICD-10-CM | POA: Diagnosis not present

## 2019-05-04 DIAGNOSIS — J3089 Other allergic rhinitis: Secondary | ICD-10-CM | POA: Diagnosis not present

## 2019-05-04 DIAGNOSIS — J3081 Allergic rhinitis due to animal (cat) (dog) hair and dander: Secondary | ICD-10-CM | POA: Diagnosis not present

## 2019-05-06 MED FILL — ZOLPIDEM TARTRATE 10 MG TAB: 10 | 30 days supply | Qty: 30 | Fill #1

## 2019-05-10 DIAGNOSIS — J3089 Other allergic rhinitis: Secondary | ICD-10-CM | POA: Diagnosis not present

## 2019-05-10 DIAGNOSIS — J301 Allergic rhinitis due to pollen: Secondary | ICD-10-CM | POA: Diagnosis not present

## 2019-05-10 DIAGNOSIS — J3081 Allergic rhinitis due to animal (cat) (dog) hair and dander: Secondary | ICD-10-CM | POA: Diagnosis not present

## 2019-05-16 DIAGNOSIS — J301 Allergic rhinitis due to pollen: Secondary | ICD-10-CM | POA: Diagnosis not present

## 2019-05-16 DIAGNOSIS — J3089 Other allergic rhinitis: Secondary | ICD-10-CM | POA: Diagnosis not present

## 2019-05-16 DIAGNOSIS — J3081 Allergic rhinitis due to animal (cat) (dog) hair and dander: Secondary | ICD-10-CM | POA: Diagnosis not present

## 2019-05-19 MED FILL — LEVOCETIRIZINE 5 MG TABLET: 5 | 60 days supply | Qty: 60 | Fill #1

## 2019-05-27 DIAGNOSIS — J301 Allergic rhinitis due to pollen: Secondary | ICD-10-CM | POA: Diagnosis not present

## 2019-05-27 DIAGNOSIS — J3081 Allergic rhinitis due to animal (cat) (dog) hair and dander: Secondary | ICD-10-CM | POA: Diagnosis not present

## 2019-05-27 DIAGNOSIS — J3089 Other allergic rhinitis: Secondary | ICD-10-CM | POA: Diagnosis not present

## 2019-06-02 DIAGNOSIS — J301 Allergic rhinitis due to pollen: Secondary | ICD-10-CM | POA: Diagnosis not present

## 2019-06-02 DIAGNOSIS — J3081 Allergic rhinitis due to animal (cat) (dog) hair and dander: Secondary | ICD-10-CM | POA: Diagnosis not present

## 2019-06-02 DIAGNOSIS — J3089 Other allergic rhinitis: Secondary | ICD-10-CM | POA: Diagnosis not present

## 2019-06-09 DIAGNOSIS — J301 Allergic rhinitis due to pollen: Secondary | ICD-10-CM | POA: Diagnosis not present

## 2019-06-09 DIAGNOSIS — J3081 Allergic rhinitis due to animal (cat) (dog) hair and dander: Secondary | ICD-10-CM | POA: Diagnosis not present

## 2019-06-09 DIAGNOSIS — J3089 Other allergic rhinitis: Secondary | ICD-10-CM | POA: Diagnosis not present

## 2019-06-15 DIAGNOSIS — J301 Allergic rhinitis due to pollen: Secondary | ICD-10-CM | POA: Diagnosis not present

## 2019-06-15 DIAGNOSIS — J3089 Other allergic rhinitis: Secondary | ICD-10-CM | POA: Diagnosis not present

## 2019-06-15 DIAGNOSIS — J3081 Allergic rhinitis due to animal (cat) (dog) hair and dander: Secondary | ICD-10-CM | POA: Diagnosis not present

## 2019-06-23 ENCOUNTER — Encounter: Payer: Self-pay | Admitting: Family Medicine

## 2019-06-28 NOTE — Progress Notes (Deleted)
Katherine Macias is a 43 y.o. female who presents for a complete physical.  She has the following concerns:  H/o erosive gastritis, and has recurrent epigastric pain if she stops PPI. She has been taking Protonix daily without any problems. She has recurrent symptoms if she misses it for 2-3 days. Denies dysphagia or chest pain. She has some mild intermittent heartburn/reflux, and uses Tums prn(about once a month, triggered by spicy food, red wine).  Insomnia:This was worse when [place] was living abroad, only sporadic now.  She uses ambien prn with good results. Last filled #30 in 11/2018. UPDATE ?melatonin with shift changes?  Allergies: She used to take allergy shots, but stopped when she was living out of the country. Since home, she is back on Zyrtec and flonase, and allergies are controlled.   Immunization History  Administered Date(s) Administered  . Influenza Split 08/02/2013  . Td 04/03/2004, 03/12/2015  . Tdap 05/29/2005   has gotten Hepatitis A vaccines (no copy of travel vaccines in her chart). gets flu shots yearly through work  Last Pap smear: 06/2018, normal, no high risk HPV Last mammogram:08/2018, requiring 6 mo f/u on Left, done 02/2019; scheduled for bilat mammo 08/2019 Last colonoscopy: flex sig 07/2012 Last DEXA: n/a  Dentist: every 6 months  Ophtho: goes every 2 years, scheduled Exercise: 4 days/week (running and biking, weights at least 2x/wk)  Lipids: Lab Results  Component Value Date   CHOL 174 06/17/2018   HDL 94 06/17/2018   LDLCALC 72 06/17/2018   TRIG 38 06/17/2018   CHOLHDL 1.9 06/17/2018   Vitamin D-OH level 41 03/2016 Normal Mg and TSH in 03/2016 Had CBC, c-met also done 06/2018.    ROS: The patient denies anorexia, fever, weight changes,headaches,vision changes, decreased hearing, ear pain, sore throat, breast concerns, chest pain, palpitations, dizziness, syncope, dyspnea on exertion, cough, swelling, vomiting, diarrhea,  constipation, melena, hematochezia, hematuria, incontinence, dysuria, irregular menstrual cycles, vaginal discharge, odor or itch, genital lesions, joint pains, numbness, tingling, weakness, tremor, suspicious skin lesions, depression, abnormal bleeding/bruising, or enlarged lymph nodes.  Insomnia (worse when out of the country, intermittent when home).   PHYSICAL EXAM:  Wt Readings from Last 3 Encounters:  06/17/18 110 lb 12.8 oz (50.3 kg)  03/16/17 109 lb (49.4 kg)  03/12/16 107 lb (48.5 kg)   General Appearance:  Alert, cooperative, no distress, appears stated age   Head:  Normocephalic, without obvious abnormality, atraumatic   Eyes:  PERRL, conjunctiva/corneas clear, EOM's intact, fundi benign   Ears:  Normal TM's and external ear canals   Nose:  Not examined, wearing mask due to COVID-19 pandemic  Throat:  Not examined, wearing mask due to COVID-19 poandemic  Neck:  Supple; thyroid: no enlargement/tenderness/nodules; no carotid bruit or JVD. No lymphadenopathy  Back:  Spine nontender, no curvature, ROM normal, no CVA tenderness   Lungs:  Clear to auscultation bilaterally without wheezes, rales or ronchi; respirations unlabored   Chest Wall:  No tenderness or deformity   Heart:  Regular rate and rhythm, S1 and S2 normal, no murmur, rub or gallop   Breast Exam:  No tenderness, masses, or nipple discharge or inversion. No axillary lymphadenopathy   Abdomen:  Soft, non-tender, nondistended, normoactive bowel sounds, no masses, no hepatosplenomegaly   Genitalia:  Normal external genitalia without lesions. BUS and vagina normal; no cervical motion tenderness. Uterus and adnexa not enlarged, nontender, no masses. Pap not performed.  Rectal:  Normal sphincter tone, no mass. Light brown stool, heme negative  Extremities:  No clubbing, cyanosis or edema   Pulses:  2+ and symmetric all extremities   Skin:  Skin color, texture,  turgor normal, no rashes or lesions   Lymph nodes:  Cervical, supraclavicular, and axillary nodes normal   Neurologic:  CNII-XII intact, normal strength, sensation and gait; reflexes 2+ and symmetric throughout    Psych: Normal mood, affect, hygiene and grooming  ASSESSMENT/PLAN:  Discussed monthly self breast exams and yearly mammograms after the age of 68; at least 30 minutes of aerobic activity at least 5 days/week, weight-bearing exercise 2-3x/week; proper sunscreen use reviewed; healthy diet, including goals of calcium and vitamin D intake and alcohol recommendations (less than or equal to 1 drink/day) reviewed; regular seatbelt use; changing batteries in smoke detectors. Immunizations UTD, continue yearly flu shots. Colonoscopy age 59.  Continue long-term PPI, since needed, and continue supplements/vitamins.

## 2019-06-29 ENCOUNTER — Encounter: Payer: Self-pay | Admitting: Family Medicine

## 2019-06-29 ENCOUNTER — Encounter: Payer: 59 | Admitting: Family Medicine

## 2019-06-30 DIAGNOSIS — J3089 Other allergic rhinitis: Secondary | ICD-10-CM | POA: Diagnosis not present

## 2019-06-30 DIAGNOSIS — J301 Allergic rhinitis due to pollen: Secondary | ICD-10-CM | POA: Diagnosis not present

## 2019-06-30 DIAGNOSIS — J3081 Allergic rhinitis due to animal (cat) (dog) hair and dander: Secondary | ICD-10-CM | POA: Diagnosis not present

## 2019-07-07 ENCOUNTER — Encounter: Payer: Self-pay | Admitting: Family Medicine

## 2019-07-14 DIAGNOSIS — J3089 Other allergic rhinitis: Secondary | ICD-10-CM | POA: Diagnosis not present

## 2019-07-14 DIAGNOSIS — J301 Allergic rhinitis due to pollen: Secondary | ICD-10-CM | POA: Diagnosis not present

## 2019-07-14 DIAGNOSIS — J3081 Allergic rhinitis due to animal (cat) (dog) hair and dander: Secondary | ICD-10-CM | POA: Diagnosis not present

## 2019-07-18 MED FILL — LEVOCETIRIZINE 5 MG TABLET: 5 | 30 days supply | Qty: 30 | Fill #0

## 2019-07-20 DIAGNOSIS — J3089 Other allergic rhinitis: Secondary | ICD-10-CM | POA: Diagnosis not present

## 2019-07-20 DIAGNOSIS — J3081 Allergic rhinitis due to animal (cat) (dog) hair and dander: Secondary | ICD-10-CM | POA: Diagnosis not present

## 2019-07-20 DIAGNOSIS — J301 Allergic rhinitis due to pollen: Secondary | ICD-10-CM | POA: Diagnosis not present

## 2019-07-28 DIAGNOSIS — J3081 Allergic rhinitis due to animal (cat) (dog) hair and dander: Secondary | ICD-10-CM | POA: Diagnosis not present

## 2019-07-28 DIAGNOSIS — J301 Allergic rhinitis due to pollen: Secondary | ICD-10-CM | POA: Diagnosis not present

## 2019-07-28 DIAGNOSIS — J3089 Other allergic rhinitis: Secondary | ICD-10-CM | POA: Diagnosis not present

## 2019-07-28 MED FILL — LEVOCETIRIZINE 5 MG TABLET: 5 | 30 days supply | Qty: 30 | Fill #0

## 2019-08-02 NOTE — Progress Notes (Signed)
Chief Complaint  Patient presents with  . Annual Exam    nonfasting annual exam with pelvic. Sees eye doctor. No concerns. (started menstrual cycle last evening)   Katherine Macias is a 43 y.o. female who presents for a complete physical.  She is on her cycle--started yesterday and is very heavy.  Would prefer not to have pelvic if not needed. Cycle is a little early, but otherwise periods are pretty normal. Denies any pelvic pain, vaginal discharge or other concerns.  H/o erosive gastritis, and has recurrent epigastric pain if she stops PPI. She has been taking Protonix daily without any problems. She has recurrent symptoms if she misses it for 2-3 days. Denies dysphagia or chest pain. She has some mild intermittent heartburn/reflux, and uses Tums prn(about once a month, triggered by spicy food, red wine).Last EGD was 2013, at time of diagnosis.  Insomnia:This was worse when she was living abroad, only sporadic now.  She uses ambien prn with good results. Last prescribed #30 with 1 refill in 11/2018.  She has filled the refill, still has about 20 left. Recently went back to 3rd shift, does okay except for flip-over days when working 3rd shift. Seemed to have more of a problem when working first and second shifts, sometimes needing more regularly. Stressors related to moving/buy house/renovating.  Allergies: She is back on allergy shots (had stopped when she was living out of the country). She continues on Xyzal, astelin and flonase, and is doing well.  Due for repeat skin tests soon per pt.   Immunization History  Administered Date(s) Administered  . Influenza Split 08/02/2013  . Td 04/03/2004, 03/12/2015  . Tdap 05/29/2005   has gotten Hepatitis Avaccines(no copy of travel vaccines in her chart). gets flu shots yearly through work --scheduled to get in 2 weeks. Last Pap smear: 06/2018, normal, no high risk HPV Last mammogram:08/2018, requiring 6 mo f/u on Left, done 02/2019;  scheduled for bilat mammo 08/2019 Last colonoscopy: flex sig 07/2012 Last DEXA: n/a  Dentist: every 6 months  Ophtho: goes every 2 years Exercise: 4 days/week (running and biking, weights at least 2x/wk) Lipids: Lab Results  Component Value Date   CHOL 174 06/17/2018   HDL 94 06/17/2018   LDLCALC 72 06/17/2018   TRIG 38 06/17/2018   CHOLHDL 1.9 06/17/2018   Vitamin D-OH level 41 03/2016 Normal Mg and TSH in 03/2016 Had CBC, c-met also done 06/2018.  Past Medical History:  Diagnosis Date  . Allergy    tree/dust,cat,dog,feather,mold,grass,weed--on immunotherapy  . Erosive gastritis 07/2012  . Genital warts 05/2009   treated with liquid nitrogen  . Internal hemorrhoid 07/2012   seen on flex sig  . Seasonal allergies     Past Surgical History:  Procedure Laterality Date  . CERVICAL POLYPECTOMY  04/24/08   benign  . ESOPHAGOGASTRODUODENOSCOPY  07/2012   erosive gastritis  . FLEXIBLE SIGMOIDOSCOPY  07/2012   internal hemorrhoid  . TONSILLECTOMY    . WISDOM TOOTH EXTRACTION      Social History   Socioeconomic History  . Marital status: Married    Spouse name: Not on file  . Number of children: 0  . Years of education: Not on file  . Highest education level: Not on file  Occupational History  . Occupation: Marine scientist: [employer]  . Financial resource strain: Not on file  . Food insecurity    Worry: Not on file    Inability: Not on file  .  Transportation needs    Medical: Not on file    Non-medical: Not on file  Tobacco Use  . Smoking status: Never Smoker  . Smokeless tobacco: Never Used  Substance and Sexual Activity  . Alcohol use: Yes    Comment: 1-2 glasses, 1-2 times per week  . Drug use: No  . Sexual activity: Yes    Birth control/protection: Other-see comments    Comment: husband s/p vasectomy  Lifestyle  . Physical activity    Days per week: Not on file    Minutes per session: Not on file  . Stress: Not on file   Relationships  . Social Herbalist on phone: Not on file    Gets together: Not on file    Attends religious service: Not on file    Active member of club or organization: Not on file    Attends meetings of clubs or organizations: Not on file    Relationship status: Not on file  . Intimate partner violence    Fear of current or ex partner: Not on file    Emotionally abused: Not on file    Physically abused: Not on file    Forced sexual activity: Not on file  Other Topics Concern  . Not on file  Social History Narrative   Married. 2 stepchildren (live with their mom, college-age).  No pets   Daily caffeine 1-2/day   Pharmacist for [employer]    Family History  Problem Relation Age of Onset  . Hypertension Mother   . Breast cancer Mother 4       BRCA neg  . Hypertension Father   . Breast cancer Paternal Aunt   . Diabetes Paternal Grandmother   . Breast cancer Paternal Aunt   . Breast cancer Cousin 64       BRCA+  . Colon cancer Neg Hx     Outpatient Encounter Medications as of 08/04/2019  Medication Sig Note  . azelastine (ASTELIN) 0.1 % nasal spray Place 1 spray into both nostrils 2 (two) times daily.    . Calcium-Phosphorus-Vitamin D (CALCIUM GUMMIES PO) Take 1 each by mouth daily. 08/04/2019: Every other day  . fluticasone (FLONASE) 50 MCG/ACT nasal spray PLACE 2 SPRAYS INTO THE NOSE DAILY.   Marland Kitchen levocetirizine (XYZAL) 5 MG tablet Take 5 mg by mouth every evening.    . Multiple Vitamins-Calcium (ONE-A-DAY WOMENS FORMULA PO) Take 1 tablet by mouth daily.     . pantoprazole (PROTONIX) 40 MG tablet Take 1 tablet (40 mg total) by mouth daily.   Marland Kitchen zolpidem (AMBIEN) 10 MG tablet 1/2 - 1 tablet at bedtime as needed for insomnia   . [DISCONTINUED] pantoprazole (PROTONIX) 40 MG tablet Take 1 tablet (40 mg total) by mouth daily.   . [DISCONTINUED] cetirizine (ZYRTEC) 10 MG tablet Take 10 mg by mouth daily.    No facility-administered encounter medications on file as of  08/04/2019.     No Known Allergies   ROS: The patient denies anorexia, fever, weight changes, headaches, vision changes, decreased hearing, ear pain, sore throat, breast concerns, chest pain, palpitations, dizziness, syncope, dyspnea on exertion, cough, swelling, vomiting, diarrhea, constipation, melena, hematochezia, hematuria, incontinence, dysuria, irregular menstrual cycles, vaginal discharge, odor or itch, genital lesions, joint pains, numbness, tingling, weakness, tremor, suspicious skin lesions, depression, abnormal bleeding/bruising, or enlarged lymph nodes. Insomnia intermittently and with shift changes.  PHYSICAL EXAM:  BP 100/64   Pulse 80   Temp 98.9 F (37.2 C) (Other (Comment))  Ht '5\' 4"'$  (1.626 m)   Wt 107 lb 6.4 oz (48.7 kg)   LMP 08/03/2019 (Approximate)   BMI 18.44 kg/m   Wt Readings from Last 3 Encounters:  08/04/19 107 lb 6.4 oz (48.7 kg)  06/17/18 110 lb 12.8 oz (50.3 kg)  03/16/17 109 lb (49.4 kg)    General Appearance:  Alert, cooperative, no distress, appears stated age   Head:  Normocephalic, without obvious abnormality, atraumatic   Eyes:  PERRL, conjunctiva/corneas clear, EOM's intact, fundi benign   Ears:  Normal TM's and external ear canals   Nose:  Not examined, wearing mask due to COVID-19 pandemic  Throat:  Not examined, wearing mask due to COVID-19 poandemic  Neck:  Supple; thyroid: no enlargement/tenderness/nodules; no carotid bruit or JVD. No lymphadenopathy  Back:  Spine nontender, no curvature, ROM normal, no CVA tenderness   Lungs:  Clear to auscultation bilaterally without wheezes, rales or ronchi; respirations unlabored   Chest Wall:  No tenderness or deformity   Heart:  Regular rate and rhythm, S1 and S2 normal, no murmur, rub or gallop   Breast Exam:  No tenderness or nipple discharge or inversion. No axillary lymphadenopathy. Right breast--1-1.5 cm smooth, mobile, nontender mass as 7-8  o'clock position.  Some fibroglandular changes noted in bilateral breasts in UOQ and also in left medial breast.  Abdomen:  Soft, non-tender, nondistended, normoactive bowel sounds, no masses, no hepatosplenomegaly   Genitalia:  Deferred due to menses  Rectal:  Deferred  Extremities:  No clubbing, cyanosis or edema   Pulses:  2+ and symmetric all extremities   Skin:  Skin color, texture, turgor normal, no rashes or lesions   Lymph nodes:  Cervical, supraclavicular, and axillary nodes normal   Neurologic:  CNII-XII intact, normal strength, sensation and gait; reflexes 2+ and symmetric throughout    Psych: Normal mood, affect, hygiene and grooming   ASSESSMENT/PLAN:  Annual physical exam - Plan: Comprehensive metabolic panel, CBC with Differential/Platelet  Insomnia, unspecified type - intermittent, and also uses ambien related to shift changes. Cont ambien prn, risks/SE reviewed  Gastroesophageal reflux disease, esophagitis presence not specified - Cont Protonix daily and vitamins, risks of untreated GERD vs chronic PPI reviewed. Plan repeat EGD with colonoscopy age 12, sooner if needed  Medication monitoring encounter - Plan: Comprehensive metabolic panel, CBC with Differential/Platelet  Gastroesophageal reflux disease - Plan: pantoprazole (PROTONIX) 40 MG tablet  Erosive gastritis - continue PPI given recurrent pain when stopped; risks of longterm PPI's reviewed - Plan: pantoprazole (PROTONIX) 40 MG tablet  Breast mass, right - smooth, mobile, at 7-8 o'clock. Suspect cyst.  On cycle now.  has mammogram scheduled Monday. Pt advised to monitor for change   Discussed monthly self breast exams and yearly mammograms after the age of 110; at least 30 minutes of aerobic activity at least 5 days/week, weight-bearing exercise 2-3x/week; proper sunscreen use reviewed; healthy diet, including goals of calcium and vitamin D intake and alcohol  recommendations (less than or equal to 1 drink/day) reviewed; regular seatbelt use; changing batteries in smoke detectors. Immunizations UTD, continue yearly flu shots. Colonoscopy age 36.  Likely should have EGD at that time as well, sooner if any symptoms suggestive of problems.   Continue long-term PPI, since needed, and continue supplements/vitamins.  Cbc, c-met Lipids good on last check.  F/u 1 year, sooner prn

## 2019-08-04 ENCOUNTER — Encounter: Payer: Self-pay | Admitting: Family Medicine

## 2019-08-04 ENCOUNTER — Ambulatory Visit (INDEPENDENT_AMBULATORY_CARE_PROVIDER_SITE_OTHER): Payer: 59 | Admitting: Family Medicine

## 2019-08-04 ENCOUNTER — Other Ambulatory Visit: Payer: Self-pay

## 2019-08-04 VITALS — BP 100/64 | HR 80 | Temp 98.9°F | Ht 64.0 in | Wt 107.4 lb

## 2019-08-04 DIAGNOSIS — G4700 Insomnia, unspecified: Secondary | ICD-10-CM | POA: Diagnosis not present

## 2019-08-04 DIAGNOSIS — K219 Gastro-esophageal reflux disease without esophagitis: Secondary | ICD-10-CM | POA: Diagnosis not present

## 2019-08-04 DIAGNOSIS — K2960 Other gastritis without bleeding: Secondary | ICD-10-CM

## 2019-08-04 DIAGNOSIS — Z0000 Encounter for general adult medical examination without abnormal findings: Secondary | ICD-10-CM

## 2019-08-04 DIAGNOSIS — J3089 Other allergic rhinitis: Secondary | ICD-10-CM | POA: Diagnosis not present

## 2019-08-04 DIAGNOSIS — N6310 Unspecified lump in the right breast, unspecified quadrant: Secondary | ICD-10-CM | POA: Diagnosis not present

## 2019-08-04 DIAGNOSIS — J3081 Allergic rhinitis due to animal (cat) (dog) hair and dander: Secondary | ICD-10-CM | POA: Diagnosis not present

## 2019-08-04 DIAGNOSIS — Z5181 Encounter for therapeutic drug level monitoring: Secondary | ICD-10-CM | POA: Diagnosis not present

## 2019-08-04 DIAGNOSIS — J301 Allergic rhinitis due to pollen: Secondary | ICD-10-CM | POA: Diagnosis not present

## 2019-08-04 MED ORDER — PANTOPRAZOLE SODIUM 40 MG PO TBEC: 40.0000 mg | DELAYED_RELEASE_TABLET | Freq: Every day | ORAL | 3 refills | Status: DC

## 2019-08-04 MED FILL — PANTOPRAZOLE SOD DR 40 MG T: 40 | 90 days supply | Qty: 90 | Fill #0

## 2019-08-04 NOTE — Patient Instructions (Signed)
  HEALTH MAINTENANCE RECOMMENDATIONS:  It is recommended that you get at least 30 minutes of aerobic exercise at least 5 days/week (for weight loss, you may need as much as 60-90 minutes). This can be any activity that gets your heart rate up. This can be divided in 10-15 minute intervals if needed, but try and build up your endurance at least once a week.  Weight bearing exercise is also recommended twice weekly.  Eat a healthy diet with lots of vegetables, fruits and fiber.  "Colorful" foods have a lot of vitamins (ie green vegetables, tomatoes, red peppers, etc).  Limit sweet tea, regular sodas and alcoholic beverages, all of which has a lot of calories and sugar.  Up to 1 alcoholic drink daily may be beneficial for women (unless trying to lose weight, watch sugars).  Drink a lot of water.  Calcium recommendations are 1200-1500 mg daily (1500 mg for postmenopausal women or women without ovaries), and vitamin D 1000 IU daily.  This should be obtained from diet and/or supplements (vitamins), and calcium should not be taken all at once, but in divided doses.  Monthly self breast exams and yearly mammograms for women over the age of 37 is recommended.  Sunscreen of at least SPF 30 should be used on all sun-exposed parts of the skin when outside between the hours of 10 am and 4 pm (not just when at beach or pool, but even with exercise, golf, tennis, and yard work!)  Use a sunscreen that says "broad spectrum" so it covers both UVA and UVB rays, and make sure to reapply every 1-2 hours.  Remember to change the batteries in your smoke detectors when changing your clock times in the spring and fall. Carbon monoxide detectors are recommended for your home.  Use your seat belt every time you are in a car, and please drive safely and not be distracted with cell phones and texting while driving.  Continue your daily multivitamin (and calcium with D every day or every other day). Try and work on Psychologist, clinical mass.

## 2019-08-05 LAB — CBC WITH DIFFERENTIAL/PLATELET
Basophils Absolute: 0.1 10*3/uL (ref 0.0–0.2)
Basos: 1 %
EOS (ABSOLUTE): 0.1 10*3/uL (ref 0.0–0.4)
Eos: 2 %
Hematocrit: 38.5 % (ref 34.0–46.6)
Hemoglobin: 12.7 g/dL (ref 11.1–15.9)
Immature Grans (Abs): 0 10*3/uL (ref 0.0–0.1)
Immature Granulocytes: 0 %
Lymphocytes Absolute: 1.7 10*3/uL (ref 0.7–3.1)
Lymphs: 21 %
MCH: 30.2 pg (ref 26.6–33.0)
MCHC: 33 g/dL (ref 31.5–35.7)
MCV: 92 fL (ref 79–97)
Monocytes Absolute: 0.5 10*3/uL (ref 0.1–0.9)
Monocytes: 6 %
Neutrophils Absolute: 5.8 10*3/uL (ref 1.4–7.0)
Neutrophils: 70 %
Platelets: 218 10*3/uL (ref 150–450)
RBC: 4.2 x10E6/uL (ref 3.77–5.28)
RDW: 12.1 % (ref 11.7–15.4)
WBC: 8.3 10*3/uL (ref 3.4–10.8)

## 2019-08-05 LAB — COMPREHENSIVE METABOLIC PANEL
ALT: 11 IU/L (ref 0–32)
AST: 16 IU/L (ref 0–40)
Albumin/Globulin Ratio: 1.8 (ref 1.2–2.2)
Albumin: 4.2 g/dL (ref 3.8–4.8)
Alkaline Phosphatase: 49 IU/L (ref 39–117)
BUN/Creatinine Ratio: 13 (ref 9–23)
BUN: 12 mg/dL (ref 6–24)
Bilirubin Total: 0.4 mg/dL (ref 0.0–1.2)
CO2: 25 mmol/L (ref 20–29)
Calcium: 9.2 mg/dL (ref 8.7–10.2)
Chloride: 104 mmol/L (ref 96–106)
Creatinine, Ser: 0.89 mg/dL (ref 0.57–1.00)
GFR calc Af Amer: 92 mL/min/{1.73_m2} (ref >59)
GFR calc non Af Amer: 80 mL/min/{1.73_m2} (ref >59)
Globulin, Total: 2.3 g/dL (ref 1.5–4.5)
Glucose: 89 mg/dL (ref 65–99)
Potassium: 4.2 mmol/L (ref 3.5–5.2)
Sodium: 140 mmol/L (ref 134–144)
Total Protein: 6.5 g/dL (ref 6.0–8.5)

## 2019-08-08 ENCOUNTER — Other Ambulatory Visit: Payer: Self-pay | Admitting: Family Medicine

## 2019-08-08 ENCOUNTER — Ambulatory Visit
Admission: RE | Admit: 2019-08-08 | Discharge: 2019-08-08 | Disposition: A | Discharge status: Home / Self Care | Payer: 59 | Source: Ambulatory Visit | Attending: Family Medicine | Admitting: Family Medicine

## 2019-08-08 ENCOUNTER — Other Ambulatory Visit: Payer: Self-pay

## 2019-08-08 DIAGNOSIS — R921 Mammographic calcification found on diagnostic imaging of breast: Secondary | ICD-10-CM | POA: Diagnosis not present

## 2019-08-08 DIAGNOSIS — N6310 Unspecified lump in the right breast, unspecified quadrant: Secondary | ICD-10-CM

## 2019-08-08 DIAGNOSIS — N6001 Solitary cyst of right breast: Secondary | ICD-10-CM | POA: Diagnosis not present

## 2019-08-09 DIAGNOSIS — J3081 Allergic rhinitis due to animal (cat) (dog) hair and dander: Secondary | ICD-10-CM | POA: Diagnosis not present

## 2019-08-09 DIAGNOSIS — J301 Allergic rhinitis due to pollen: Secondary | ICD-10-CM | POA: Diagnosis not present

## 2019-08-09 DIAGNOSIS — J3089 Other allergic rhinitis: Secondary | ICD-10-CM | POA: Diagnosis not present

## 2019-08-16 DIAGNOSIS — J3089 Other allergic rhinitis: Secondary | ICD-10-CM | POA: Diagnosis not present

## 2019-08-16 DIAGNOSIS — J301 Allergic rhinitis due to pollen: Secondary | ICD-10-CM | POA: Diagnosis not present

## 2019-08-16 DIAGNOSIS — J3081 Allergic rhinitis due to animal (cat) (dog) hair and dander: Secondary | ICD-10-CM | POA: Diagnosis not present

## 2019-08-23 DIAGNOSIS — J3081 Allergic rhinitis due to animal (cat) (dog) hair and dander: Secondary | ICD-10-CM | POA: Diagnosis not present

## 2019-08-23 DIAGNOSIS — J301 Allergic rhinitis due to pollen: Secondary | ICD-10-CM | POA: Diagnosis not present

## 2019-08-23 DIAGNOSIS — J3089 Other allergic rhinitis: Secondary | ICD-10-CM | POA: Diagnosis not present

## 2019-08-23 MED FILL — LEVOCETIRIZINE 5 MG TABLET: 5 | 90 days supply | Qty: 90 | Fill #0

## 2019-09-02 DIAGNOSIS — J3081 Allergic rhinitis due to animal (cat) (dog) hair and dander: Secondary | ICD-10-CM | POA: Diagnosis not present

## 2019-09-02 DIAGNOSIS — J301 Allergic rhinitis due to pollen: Secondary | ICD-10-CM | POA: Diagnosis not present

## 2019-09-02 DIAGNOSIS — J3089 Other allergic rhinitis: Secondary | ICD-10-CM | POA: Diagnosis not present

## 2019-09-07 DIAGNOSIS — J301 Allergic rhinitis due to pollen: Secondary | ICD-10-CM | POA: Diagnosis not present

## 2019-09-07 DIAGNOSIS — J3081 Allergic rhinitis due to animal (cat) (dog) hair and dander: Secondary | ICD-10-CM | POA: Diagnosis not present

## 2019-09-07 DIAGNOSIS — J3089 Other allergic rhinitis: Secondary | ICD-10-CM | POA: Diagnosis not present

## 2019-09-13 DIAGNOSIS — J3081 Allergic rhinitis due to animal (cat) (dog) hair and dander: Secondary | ICD-10-CM | POA: Diagnosis not present

## 2019-09-13 DIAGNOSIS — J301 Allergic rhinitis due to pollen: Secondary | ICD-10-CM | POA: Diagnosis not present

## 2019-09-13 DIAGNOSIS — J3089 Other allergic rhinitis: Secondary | ICD-10-CM | POA: Diagnosis not present

## 2019-09-22 DIAGNOSIS — J3081 Allergic rhinitis due to animal (cat) (dog) hair and dander: Secondary | ICD-10-CM | POA: Diagnosis not present

## 2019-09-22 DIAGNOSIS — J3089 Other allergic rhinitis: Secondary | ICD-10-CM | POA: Diagnosis not present

## 2019-09-22 DIAGNOSIS — J301 Allergic rhinitis due to pollen: Secondary | ICD-10-CM | POA: Diagnosis not present

## 2019-09-27 DIAGNOSIS — J3081 Allergic rhinitis due to animal (cat) (dog) hair and dander: Secondary | ICD-10-CM | POA: Diagnosis not present

## 2019-09-27 DIAGNOSIS — J3089 Other allergic rhinitis: Secondary | ICD-10-CM | POA: Diagnosis not present

## 2019-09-27 DIAGNOSIS — J301 Allergic rhinitis due to pollen: Secondary | ICD-10-CM | POA: Diagnosis not present

## 2019-10-05 DIAGNOSIS — J301 Allergic rhinitis due to pollen: Secondary | ICD-10-CM | POA: Diagnosis not present

## 2019-10-05 DIAGNOSIS — J3081 Allergic rhinitis due to animal (cat) (dog) hair and dander: Secondary | ICD-10-CM | POA: Diagnosis not present

## 2019-10-05 DIAGNOSIS — J3089 Other allergic rhinitis: Secondary | ICD-10-CM | POA: Diagnosis not present

## 2019-10-12 DIAGNOSIS — J3081 Allergic rhinitis due to animal (cat) (dog) hair and dander: Secondary | ICD-10-CM | POA: Diagnosis not present

## 2019-10-12 DIAGNOSIS — J3089 Other allergic rhinitis: Secondary | ICD-10-CM | POA: Diagnosis not present

## 2019-10-12 DIAGNOSIS — J301 Allergic rhinitis due to pollen: Secondary | ICD-10-CM | POA: Diagnosis not present

## 2019-10-18 DIAGNOSIS — J3081 Allergic rhinitis due to animal (cat) (dog) hair and dander: Secondary | ICD-10-CM | POA: Diagnosis not present

## 2019-10-18 DIAGNOSIS — J301 Allergic rhinitis due to pollen: Secondary | ICD-10-CM | POA: Diagnosis not present

## 2019-10-18 DIAGNOSIS — J3089 Other allergic rhinitis: Secondary | ICD-10-CM | POA: Diagnosis not present

## 2019-10-24 DIAGNOSIS — J301 Allergic rhinitis due to pollen: Secondary | ICD-10-CM | POA: Diagnosis not present

## 2019-10-24 DIAGNOSIS — J3081 Allergic rhinitis due to animal (cat) (dog) hair and dander: Secondary | ICD-10-CM | POA: Diagnosis not present

## 2019-10-24 DIAGNOSIS — J3089 Other allergic rhinitis: Secondary | ICD-10-CM | POA: Diagnosis not present

## 2019-11-01 DIAGNOSIS — J3089 Other allergic rhinitis: Secondary | ICD-10-CM | POA: Diagnosis not present

## 2019-11-01 DIAGNOSIS — J3081 Allergic rhinitis due to animal (cat) (dog) hair and dander: Secondary | ICD-10-CM | POA: Diagnosis not present

## 2019-11-01 DIAGNOSIS — J301 Allergic rhinitis due to pollen: Secondary | ICD-10-CM | POA: Diagnosis not present

## 2019-11-07 DIAGNOSIS — J3089 Other allergic rhinitis: Secondary | ICD-10-CM | POA: Diagnosis not present

## 2019-11-07 DIAGNOSIS — J3081 Allergic rhinitis due to animal (cat) (dog) hair and dander: Secondary | ICD-10-CM | POA: Diagnosis not present

## 2019-11-07 DIAGNOSIS — J301 Allergic rhinitis due to pollen: Secondary | ICD-10-CM | POA: Diagnosis not present

## 2019-11-09 MED FILL — PANTOPRAZOLE SOD DR 40 MG T: 40 | 90 days supply | Qty: 90 | Fill #1

## 2019-11-09 MED FILL — LEVOCETIRIZINE 5 MG TABLET: 5 | 90 days supply | Qty: 90 | Fill #1

## 2019-11-16 DIAGNOSIS — J301 Allergic rhinitis due to pollen: Secondary | ICD-10-CM | POA: Diagnosis not present

## 2019-11-16 DIAGNOSIS — J3089 Other allergic rhinitis: Secondary | ICD-10-CM | POA: Diagnosis not present

## 2019-11-16 DIAGNOSIS — J3081 Allergic rhinitis due to animal (cat) (dog) hair and dander: Secondary | ICD-10-CM | POA: Diagnosis not present

## 2019-11-20 ENCOUNTER — Encounter: Payer: Self-pay | Admitting: Family Medicine

## 2019-11-20 DIAGNOSIS — G4700 Insomnia, unspecified: Secondary | ICD-10-CM

## 2019-11-21 DIAGNOSIS — J3089 Other allergic rhinitis: Secondary | ICD-10-CM | POA: Diagnosis not present

## 2019-11-21 DIAGNOSIS — J3081 Allergic rhinitis due to animal (cat) (dog) hair and dander: Secondary | ICD-10-CM | POA: Diagnosis not present

## 2019-11-21 DIAGNOSIS — J301 Allergic rhinitis due to pollen: Secondary | ICD-10-CM | POA: Diagnosis not present

## 2019-11-21 MED ORDER — ZOLPIDEM TARTRATE 10 MG PO TABS: ORAL_TABLET | ORAL | 1 refills | Status: DC

## 2019-11-21 MED FILL — ZOLPIDEM TARTRATE 10 MG TAB: 10 | 30 days supply | Qty: 30 | Fill #0

## 2019-11-28 DIAGNOSIS — J301 Allergic rhinitis due to pollen: Secondary | ICD-10-CM | POA: Diagnosis not present

## 2019-11-28 DIAGNOSIS — J3081 Allergic rhinitis due to animal (cat) (dog) hair and dander: Secondary | ICD-10-CM | POA: Diagnosis not present

## 2019-11-28 DIAGNOSIS — J3089 Other allergic rhinitis: Secondary | ICD-10-CM | POA: Diagnosis not present

## 2019-12-06 DIAGNOSIS — J301 Allergic rhinitis due to pollen: Secondary | ICD-10-CM | POA: Diagnosis not present

## 2019-12-06 DIAGNOSIS — J3081 Allergic rhinitis due to animal (cat) (dog) hair and dander: Secondary | ICD-10-CM | POA: Diagnosis not present

## 2019-12-06 DIAGNOSIS — J3089 Other allergic rhinitis: Secondary | ICD-10-CM | POA: Diagnosis not present

## 2019-12-15 DIAGNOSIS — J3081 Allergic rhinitis due to animal (cat) (dog) hair and dander: Secondary | ICD-10-CM | POA: Diagnosis not present

## 2019-12-15 DIAGNOSIS — J301 Allergic rhinitis due to pollen: Secondary | ICD-10-CM | POA: Diagnosis not present

## 2019-12-15 DIAGNOSIS — J3089 Other allergic rhinitis: Secondary | ICD-10-CM | POA: Diagnosis not present

## 2019-12-20 DIAGNOSIS — J3081 Allergic rhinitis due to animal (cat) (dog) hair and dander: Secondary | ICD-10-CM | POA: Diagnosis not present

## 2019-12-20 DIAGNOSIS — J301 Allergic rhinitis due to pollen: Secondary | ICD-10-CM | POA: Diagnosis not present

## 2019-12-20 DIAGNOSIS — J3089 Other allergic rhinitis: Secondary | ICD-10-CM | POA: Diagnosis not present

## 2019-12-28 DIAGNOSIS — J3089 Other allergic rhinitis: Secondary | ICD-10-CM | POA: Diagnosis not present

## 2019-12-28 DIAGNOSIS — J3081 Allergic rhinitis due to animal (cat) (dog) hair and dander: Secondary | ICD-10-CM | POA: Diagnosis not present

## 2019-12-28 DIAGNOSIS — J301 Allergic rhinitis due to pollen: Secondary | ICD-10-CM | POA: Diagnosis not present

## 2019-12-29 DIAGNOSIS — J3089 Other allergic rhinitis: Secondary | ICD-10-CM | POA: Diagnosis not present

## 2019-12-29 DIAGNOSIS — J3081 Allergic rhinitis due to animal (cat) (dog) hair and dander: Secondary | ICD-10-CM | POA: Diagnosis not present

## 2020-01-04 DIAGNOSIS — J301 Allergic rhinitis due to pollen: Secondary | ICD-10-CM | POA: Diagnosis not present

## 2020-01-04 DIAGNOSIS — J3081 Allergic rhinitis due to animal (cat) (dog) hair and dander: Secondary | ICD-10-CM | POA: Diagnosis not present

## 2020-01-04 DIAGNOSIS — J3089 Other allergic rhinitis: Secondary | ICD-10-CM | POA: Diagnosis not present

## 2020-01-12 DIAGNOSIS — J3081 Allergic rhinitis due to animal (cat) (dog) hair and dander: Secondary | ICD-10-CM | POA: Diagnosis not present

## 2020-01-12 DIAGNOSIS — J3089 Other allergic rhinitis: Secondary | ICD-10-CM | POA: Diagnosis not present

## 2020-01-12 DIAGNOSIS — J301 Allergic rhinitis due to pollen: Secondary | ICD-10-CM | POA: Diagnosis not present

## 2020-01-12 MED FILL — EPINEPHRINE 0.3 MG AUTO-INJ: 0.3 | 30 days supply | Qty: 2 | Fill #0

## 2020-01-20 DIAGNOSIS — J301 Allergic rhinitis due to pollen: Secondary | ICD-10-CM | POA: Diagnosis not present

## 2020-01-20 DIAGNOSIS — J3089 Other allergic rhinitis: Secondary | ICD-10-CM | POA: Diagnosis not present

## 2020-01-20 DIAGNOSIS — J3081 Allergic rhinitis due to animal (cat) (dog) hair and dander: Secondary | ICD-10-CM | POA: Diagnosis not present

## 2020-01-24 DIAGNOSIS — J3081 Allergic rhinitis due to animal (cat) (dog) hair and dander: Secondary | ICD-10-CM | POA: Diagnosis not present

## 2020-01-24 DIAGNOSIS — J301 Allergic rhinitis due to pollen: Secondary | ICD-10-CM | POA: Diagnosis not present

## 2020-01-24 DIAGNOSIS — J3089 Other allergic rhinitis: Secondary | ICD-10-CM | POA: Diagnosis not present

## 2020-01-30 DIAGNOSIS — J3081 Allergic rhinitis due to animal (cat) (dog) hair and dander: Secondary | ICD-10-CM | POA: Diagnosis not present

## 2020-01-30 DIAGNOSIS — J301 Allergic rhinitis due to pollen: Secondary | ICD-10-CM | POA: Diagnosis not present

## 2020-01-30 DIAGNOSIS — J3089 Other allergic rhinitis: Secondary | ICD-10-CM | POA: Diagnosis not present

## 2020-02-02 DIAGNOSIS — J3089 Other allergic rhinitis: Secondary | ICD-10-CM | POA: Diagnosis not present

## 2020-02-02 DIAGNOSIS — J3081 Allergic rhinitis due to animal (cat) (dog) hair and dander: Secondary | ICD-10-CM | POA: Diagnosis not present

## 2020-02-02 DIAGNOSIS — J301 Allergic rhinitis due to pollen: Secondary | ICD-10-CM | POA: Diagnosis not present

## 2020-02-07 DIAGNOSIS — J3089 Other allergic rhinitis: Secondary | ICD-10-CM | POA: Diagnosis not present

## 2020-02-07 DIAGNOSIS — J301 Allergic rhinitis due to pollen: Secondary | ICD-10-CM | POA: Diagnosis not present

## 2020-02-07 DIAGNOSIS — J3081 Allergic rhinitis due to animal (cat) (dog) hair and dander: Secondary | ICD-10-CM | POA: Diagnosis not present

## 2020-02-13 DIAGNOSIS — J301 Allergic rhinitis due to pollen: Secondary | ICD-10-CM | POA: Diagnosis not present

## 2020-02-13 DIAGNOSIS — J3081 Allergic rhinitis due to animal (cat) (dog) hair and dander: Secondary | ICD-10-CM | POA: Diagnosis not present

## 2020-02-13 DIAGNOSIS — J3089 Other allergic rhinitis: Secondary | ICD-10-CM | POA: Diagnosis not present

## 2020-02-20 DIAGNOSIS — J301 Allergic rhinitis due to pollen: Secondary | ICD-10-CM | POA: Diagnosis not present

## 2020-02-20 DIAGNOSIS — J3089 Other allergic rhinitis: Secondary | ICD-10-CM | POA: Diagnosis not present

## 2020-02-20 DIAGNOSIS — J3081 Allergic rhinitis due to animal (cat) (dog) hair and dander: Secondary | ICD-10-CM | POA: Diagnosis not present

## 2020-02-27 DIAGNOSIS — J3089 Other allergic rhinitis: Secondary | ICD-10-CM | POA: Diagnosis not present

## 2020-02-27 DIAGNOSIS — J3081 Allergic rhinitis due to animal (cat) (dog) hair and dander: Secondary | ICD-10-CM | POA: Diagnosis not present

## 2020-02-27 DIAGNOSIS — J301 Allergic rhinitis due to pollen: Secondary | ICD-10-CM | POA: Diagnosis not present

## 2020-02-29 MED FILL — LEVOCETIRIZINE 5 MG TABLET: 5 | 90 days supply | Qty: 90 | Fill #2

## 2020-02-29 MED FILL — ZOLPIDEM TARTRATE 10 MG TAB: 10 | 30 days supply | Qty: 30 | Fill #1

## 2020-02-29 MED FILL — PANTOPRAZOLE SOD DR 40 MG T: 40 | 90 days supply | Qty: 90 | Fill #2

## 2020-03-07 DIAGNOSIS — J3081 Allergic rhinitis due to animal (cat) (dog) hair and dander: Secondary | ICD-10-CM | POA: Diagnosis not present

## 2020-03-07 DIAGNOSIS — J3089 Other allergic rhinitis: Secondary | ICD-10-CM | POA: Diagnosis not present

## 2020-03-07 DIAGNOSIS — J301 Allergic rhinitis due to pollen: Secondary | ICD-10-CM | POA: Diagnosis not present

## 2020-03-26 DIAGNOSIS — J3081 Allergic rhinitis due to animal (cat) (dog) hair and dander: Secondary | ICD-10-CM | POA: Diagnosis not present

## 2020-03-26 DIAGNOSIS — J301 Allergic rhinitis due to pollen: Secondary | ICD-10-CM | POA: Diagnosis not present

## 2020-03-26 DIAGNOSIS — J3089 Other allergic rhinitis: Secondary | ICD-10-CM | POA: Diagnosis not present

## 2020-04-03 DIAGNOSIS — J301 Allergic rhinitis due to pollen: Secondary | ICD-10-CM | POA: Diagnosis not present

## 2020-04-03 DIAGNOSIS — J3081 Allergic rhinitis due to animal (cat) (dog) hair and dander: Secondary | ICD-10-CM | POA: Diagnosis not present

## 2020-04-03 DIAGNOSIS — J3089 Other allergic rhinitis: Secondary | ICD-10-CM | POA: Diagnosis not present

## 2020-04-17 DIAGNOSIS — J3081 Allergic rhinitis due to animal (cat) (dog) hair and dander: Secondary | ICD-10-CM | POA: Diagnosis not present

## 2020-04-17 DIAGNOSIS — J3089 Other allergic rhinitis: Secondary | ICD-10-CM | POA: Diagnosis not present

## 2020-04-17 DIAGNOSIS — J301 Allergic rhinitis due to pollen: Secondary | ICD-10-CM | POA: Diagnosis not present

## 2020-04-27 DIAGNOSIS — J301 Allergic rhinitis due to pollen: Secondary | ICD-10-CM | POA: Diagnosis not present

## 2020-04-27 DIAGNOSIS — J3081 Allergic rhinitis due to animal (cat) (dog) hair and dander: Secondary | ICD-10-CM | POA: Diagnosis not present

## 2020-04-27 DIAGNOSIS — J3089 Other allergic rhinitis: Secondary | ICD-10-CM | POA: Diagnosis not present

## 2020-05-02 DIAGNOSIS — J3089 Other allergic rhinitis: Secondary | ICD-10-CM | POA: Diagnosis not present

## 2020-05-02 DIAGNOSIS — J301 Allergic rhinitis due to pollen: Secondary | ICD-10-CM | POA: Diagnosis not present

## 2020-05-02 DIAGNOSIS — J3081 Allergic rhinitis due to animal (cat) (dog) hair and dander: Secondary | ICD-10-CM | POA: Diagnosis not present

## 2020-05-09 DIAGNOSIS — J301 Allergic rhinitis due to pollen: Secondary | ICD-10-CM | POA: Diagnosis not present

## 2020-05-09 DIAGNOSIS — J3089 Other allergic rhinitis: Secondary | ICD-10-CM | POA: Diagnosis not present

## 2020-05-09 DIAGNOSIS — J3081 Allergic rhinitis due to animal (cat) (dog) hair and dander: Secondary | ICD-10-CM | POA: Diagnosis not present

## 2020-05-17 DIAGNOSIS — J3081 Allergic rhinitis due to animal (cat) (dog) hair and dander: Secondary | ICD-10-CM | POA: Diagnosis not present

## 2020-05-17 DIAGNOSIS — J301 Allergic rhinitis due to pollen: Secondary | ICD-10-CM | POA: Diagnosis not present

## 2020-05-17 DIAGNOSIS — J3089 Other allergic rhinitis: Secondary | ICD-10-CM | POA: Diagnosis not present

## 2020-05-23 DIAGNOSIS — J3081 Allergic rhinitis due to animal (cat) (dog) hair and dander: Secondary | ICD-10-CM | POA: Diagnosis not present

## 2020-05-23 DIAGNOSIS — J301 Allergic rhinitis due to pollen: Secondary | ICD-10-CM | POA: Diagnosis not present

## 2020-05-23 DIAGNOSIS — J3089 Other allergic rhinitis: Secondary | ICD-10-CM | POA: Diagnosis not present

## 2020-06-05 DIAGNOSIS — J3089 Other allergic rhinitis: Secondary | ICD-10-CM | POA: Diagnosis not present

## 2020-06-05 DIAGNOSIS — J301 Allergic rhinitis due to pollen: Secondary | ICD-10-CM | POA: Diagnosis not present

## 2020-06-05 DIAGNOSIS — J3081 Allergic rhinitis due to animal (cat) (dog) hair and dander: Secondary | ICD-10-CM | POA: Diagnosis not present

## 2020-06-14 DIAGNOSIS — J3089 Other allergic rhinitis: Secondary | ICD-10-CM | POA: Diagnosis not present

## 2020-06-14 DIAGNOSIS — J3081 Allergic rhinitis due to animal (cat) (dog) hair and dander: Secondary | ICD-10-CM | POA: Diagnosis not present

## 2020-06-14 DIAGNOSIS — J301 Allergic rhinitis due to pollen: Secondary | ICD-10-CM | POA: Diagnosis not present

## 2020-06-19 DIAGNOSIS — Z20828 Contact with and (suspected) exposure to other viral communicable diseases: Secondary | ICD-10-CM | POA: Diagnosis not present

## 2020-06-20 MED FILL — LEVOCETIRIZINE 5 MG TABLET: 5 | 30 days supply | Qty: 30 | Fill #0

## 2020-06-21 DIAGNOSIS — J3089 Other allergic rhinitis: Secondary | ICD-10-CM | POA: Diagnosis not present

## 2020-06-21 DIAGNOSIS — J301 Allergic rhinitis due to pollen: Secondary | ICD-10-CM | POA: Diagnosis not present

## 2020-06-21 DIAGNOSIS — J3081 Allergic rhinitis due to animal (cat) (dog) hair and dander: Secondary | ICD-10-CM | POA: Diagnosis not present

## 2020-06-21 MED FILL — PANTOPRAZOLE SOD DR 40 MG T: 40 | 90 days supply | Qty: 90 | Fill #3

## 2020-06-22 DIAGNOSIS — J3089 Other allergic rhinitis: Secondary | ICD-10-CM | POA: Diagnosis not present

## 2020-06-22 DIAGNOSIS — J3081 Allergic rhinitis due to animal (cat) (dog) hair and dander: Secondary | ICD-10-CM | POA: Diagnosis not present

## 2020-06-28 DIAGNOSIS — J301 Allergic rhinitis due to pollen: Secondary | ICD-10-CM | POA: Diagnosis not present

## 2020-06-28 DIAGNOSIS — J3081 Allergic rhinitis due to animal (cat) (dog) hair and dander: Secondary | ICD-10-CM | POA: Diagnosis not present

## 2020-06-28 DIAGNOSIS — J3089 Other allergic rhinitis: Secondary | ICD-10-CM | POA: Diagnosis not present

## 2020-07-03 DIAGNOSIS — J301 Allergic rhinitis due to pollen: Secondary | ICD-10-CM | POA: Diagnosis not present

## 2020-07-03 DIAGNOSIS — J3081 Allergic rhinitis due to animal (cat) (dog) hair and dander: Secondary | ICD-10-CM | POA: Diagnosis not present

## 2020-07-03 DIAGNOSIS — J3089 Other allergic rhinitis: Secondary | ICD-10-CM | POA: Diagnosis not present

## 2020-07-04 ENCOUNTER — Other Ambulatory Visit: Payer: Self-pay | Admitting: Family Medicine

## 2020-07-04 DIAGNOSIS — R921 Mammographic calcification found on diagnostic imaging of breast: Secondary | ICD-10-CM

## 2020-07-10 DIAGNOSIS — J301 Allergic rhinitis due to pollen: Secondary | ICD-10-CM | POA: Diagnosis not present

## 2020-07-10 DIAGNOSIS — J3089 Other allergic rhinitis: Secondary | ICD-10-CM | POA: Diagnosis not present

## 2020-07-10 DIAGNOSIS — J3081 Allergic rhinitis due to animal (cat) (dog) hair and dander: Secondary | ICD-10-CM | POA: Diagnosis not present

## 2020-07-16 DIAGNOSIS — J3089 Other allergic rhinitis: Secondary | ICD-10-CM | POA: Diagnosis not present

## 2020-07-16 DIAGNOSIS — J3081 Allergic rhinitis due to animal (cat) (dog) hair and dander: Secondary | ICD-10-CM | POA: Diagnosis not present

## 2020-07-16 DIAGNOSIS — J301 Allergic rhinitis due to pollen: Secondary | ICD-10-CM | POA: Diagnosis not present

## 2020-07-16 MED FILL — LEVOCETIRIZINE 5 MG TABLET: 5 | 30 days supply | Qty: 30 | Fill #1

## 2020-07-31 DIAGNOSIS — J301 Allergic rhinitis due to pollen: Secondary | ICD-10-CM | POA: Diagnosis not present

## 2020-07-31 DIAGNOSIS — J3089 Other allergic rhinitis: Secondary | ICD-10-CM | POA: Diagnosis not present

## 2020-07-31 DIAGNOSIS — J3081 Allergic rhinitis due to animal (cat) (dog) hair and dander: Secondary | ICD-10-CM | POA: Diagnosis not present

## 2020-08-08 ENCOUNTER — Ambulatory Visit
Admission: RE | Admit: 2020-08-08 | Discharge: 2020-08-08 | Disposition: A | Discharge status: Home / Self Care | Payer: 59 | Source: Ambulatory Visit | Attending: Family Medicine | Admitting: Family Medicine

## 2020-08-08 ENCOUNTER — Other Ambulatory Visit: Payer: Self-pay | Admitting: Family Medicine

## 2020-08-08 ENCOUNTER — Other Ambulatory Visit: Payer: Self-pay

## 2020-08-08 ENCOUNTER — Other Ambulatory Visit (HOSPITAL_COMMUNITY): Payer: Self-pay | Admitting: Allergy

## 2020-08-08 DIAGNOSIS — J3081 Allergic rhinitis due to animal (cat) (dog) hair and dander: Secondary | ICD-10-CM | POA: Diagnosis not present

## 2020-08-08 DIAGNOSIS — R921 Mammographic calcification found on diagnostic imaging of breast: Secondary | ICD-10-CM

## 2020-08-08 DIAGNOSIS — N6310 Unspecified lump in the right breast, unspecified quadrant: Secondary | ICD-10-CM

## 2020-08-08 DIAGNOSIS — J301 Allergic rhinitis due to pollen: Secondary | ICD-10-CM | POA: Diagnosis not present

## 2020-08-08 DIAGNOSIS — J3089 Other allergic rhinitis: Secondary | ICD-10-CM | POA: Diagnosis not present

## 2020-08-08 DIAGNOSIS — N6001 Solitary cyst of right breast: Secondary | ICD-10-CM | POA: Diagnosis not present

## 2020-08-08 MED FILL — AZELASTINE HCL 137 MCG SPRY: 0.1 | 75 days supply | Qty: 90 | Fill #0

## 2020-08-09 MED FILL — LEVOCETIRIZINE 5 MG TABLET: 5 | 90 days supply | Qty: 90 | Fill #0

## 2020-08-14 DIAGNOSIS — J3089 Other allergic rhinitis: Secondary | ICD-10-CM | POA: Diagnosis not present

## 2020-08-14 DIAGNOSIS — J301 Allergic rhinitis due to pollen: Secondary | ICD-10-CM | POA: Diagnosis not present

## 2020-08-14 DIAGNOSIS — J3081 Allergic rhinitis due to animal (cat) (dog) hair and dander: Secondary | ICD-10-CM | POA: Diagnosis not present

## 2020-08-17 DIAGNOSIS — J3081 Allergic rhinitis due to animal (cat) (dog) hair and dander: Secondary | ICD-10-CM | POA: Diagnosis not present

## 2020-08-17 DIAGNOSIS — J301 Allergic rhinitis due to pollen: Secondary | ICD-10-CM | POA: Diagnosis not present

## 2020-08-17 DIAGNOSIS — J3089 Other allergic rhinitis: Secondary | ICD-10-CM | POA: Diagnosis not present

## 2020-08-19 ENCOUNTER — Encounter: Payer: Self-pay | Admitting: Family Medicine

## 2020-08-21 DIAGNOSIS — J3081 Allergic rhinitis due to animal (cat) (dog) hair and dander: Secondary | ICD-10-CM | POA: Diagnosis not present

## 2020-08-21 DIAGNOSIS — J301 Allergic rhinitis due to pollen: Secondary | ICD-10-CM | POA: Diagnosis not present

## 2020-08-21 DIAGNOSIS — J3089 Other allergic rhinitis: Secondary | ICD-10-CM | POA: Diagnosis not present

## 2020-08-21 NOTE — Progress Notes (Signed)
Chief Complaint  Patient presents with  . nonfasting cpe    cpe, doesn't see obgyn.    Katherine Macias is a 44 y.o. female who presents for a complete physical.   H/o erosive gastritis. She has been taking Protonix daily, longterm without any problems. She has recurrent symptoms if she misses it for 2-3 days. She has more heartburn the day after coming off night shifts, related to timing of eating and medications. Tums is effective, not needed frequently. Denies dysphagia or chest pain.  Last EGD was 2013, at time of diagnosis.  Insomnia:This is sporadic (was worse [place] She uses ambien prn with good results. Last filled #30 in 02/2020. She needs a refill.  Sleep goes in cycles.  She is working nights now, and sometimes needs it when switching back and forth. She overall uses it less since working 3rd shift (is more tired).  Allergies: She is getting allergy shots regularly. She continues on Xyzal, astelin and flonase, and is doing well.    Immunization History  Administered Date(s) Administered  . Influenza Split 08/02/2013, 08/03/2020  . PFIZER SARS-COV-2 Vaccination 10/21/2019, 11/11/2019  . Td 04/03/2004, 03/12/2015  . Tdap 05/29/2005   has gotten Hepatitis Avaccines(no copy of travel vaccines in her chart). gets flu shots yearly through work Last Pap smear: 06/2018, normal, no high risk HPV Last mammogram:08/2020 Last colonoscopy: flex sig 07/2012 Last DEXA: n/a  Dentist: every 6 months  Ophtho: goes every 2 years Exercise: 4 days/week (running and biking, body pump classes (wt-bearing exercising 1-2x/week)) Lipids: Lab Results  Component Value Date   CHOL 174 06/17/2018   HDL 94 06/17/2018   LDLCALC 72 06/17/2018   TRIG 38 06/17/2018   CHOLHDL 1.9 06/17/2018   Vitamin D-OH level 41 03/2016 Normal Mg and TSH in 03/2016 Had CBC, c-met 08/2019, normal   PMH, PSH, SH and FH were reviewed and updated.  Outpatient Encounter Medications  as of 08/23/2020  Medication Sig Note  . azelastine (ASTELIN) 0.1 % nasal spray Place 1 spray into both nostrils 2 (two) times daily.    . Calcium-Phosphorus-Vitamin D (CALCIUM GUMMIES PO) Take 1 each by mouth daily. 08/04/2019: Every other day  . fluticasone (FLONASE) 50 MCG/ACT nasal spray PLACE 2 SPRAYS INTO THE NOSE DAILY.   Marland Kitchen levocetirizine (XYZAL) 5 MG tablet Take 5 mg by mouth every evening.    . melatonin 5 MG TABS Take 5 mg by mouth at bedtime.   . Multiple Vitamins-Calcium (ONE-A-DAY WOMENS FORMULA PO) Take 1 tablet by mouth daily.     . pantoprazole (PROTONIX) 40 MG tablet Take 1 tablet (40 mg total) by mouth daily.   Marland Kitchen zolpidem (AMBIEN) 10 MG tablet 1/2 - 1 tablet at bedtime as needed for insomnia    No facility-administered encounter medications on file as of 08/23/2020.   No Known Allergies  ROS: The patient denies anorexia, fever, weight changes, headaches, vision changes, decreased hearing, ear pain, sore throat, breast concerns, chest pain, palpitations, dizziness, syncope, dyspnea on exertion, cough, swelling, vomiting, diarrhea, constipation, melena, hematochezia, hematuria, incontinence, dysuria, irregular menstrual cycles, vaginal discharge, odor or itch, genital lesions, joint pains, numbness, tingling, weakness, tremor, suspicious skin lesions, depression, abnormal bleeding/bruising, or enlarged lymph nodes. Insomnia intermittently and with shift changes.   PHYSICAL EXAM:  BP 110/60   Pulse 75   Ht 5' 3.5" (1.613 m)   Wt 106 lb 3.2 oz (48.2 kg)   LMP 08/01/2020   BMI 18.52 kg/m  Wt Readings from Last 3 Encounters:  08/23/20 106 lb 3.2 oz (48.2 kg)  08/04/19 107 lb 6.4 oz (48.7 kg)  06/17/18 110 lb 12.8 oz (50.3 kg)    General Appearance:  Alert, cooperative, no distress, appears stated age   Head:  Normocephalic, without obvious abnormality, atraumatic   Eyes:  PERRL, conjunctiva/corneas clear, EOM's intact, fundi benign   Ears:  Normal  TM's and external ear canals   Nose:  Not examined, wearing mask due to COVID-19 pandemic  Throat:  Not examined, wearing mask due to COVID-19 poandemic  Neck:  Supple; thyroid: no enlargement/tenderness/nodules; no carotid bruit or JVD. No lymphadenopathy  Back:  Spine nontender, no curvature, ROM normal, no CVA tenderness   Lungs:  Clear to auscultation bilaterally without wheezes, rales or ronchi; respirations unlabored   Chest Wall:  No tenderness or deformity   Heart:  Regular rate and rhythm, S1 and S2 normal, no murmur, rub or gallop   Breast Exam:  No tenderness or nipple discharge or inversion. No axillary lymphadenopathy. Right breast--1.5-2cm cm smooth, mobile, nontender mass as 7-8 o'clock position. This is slightly larger than last year. Some fibroglandular changes noted in bilateral breasts in UOQ.  Abdomen:  Soft, non-tender, nondistended, normoactive bowel sounds, no masses, no hepatosplenomegaly   Genitalia:  Normal external genitalia without lesions. BUS and vagina normal; no cervical motion tenderness.  Pap not performed.  Rectal:  Normal sphincter tone, no mass. Heme negative stool  Extremities:  No clubbing, cyanosis or edema   Pulses:  2+ and symmetric all extremities   Skin:  Skin color, texture, turgor normal, no rashes or lesions   Lymph nodes:  Cervical, supraclavicular, and axillary nodes normal   Neurologic:  CNII-XII intact, normal strength, sensation and gait; reflexes 2+ and symmetric throughout    Psych: Normal mood, affect, hygiene and grooming   ASSESSMENT/PLAN:  Annual physical exam - Plan: CBC with Differential/Platelet, Comprehensive metabolic panel, Magnesium, POCT Urinalysis DIP (Proadvantage Device)  Medication monitoring encounter - Plan: CBC with Differential/Platelet, Comprehensive metabolic panel, Magnesium  Gastroesophageal reflux disease - Plan: pantoprazole  (PROTONIX) 40 MG tablet  Erosive gastritis - continue PPI given recurrent pain when stopped; risks of longterm PPI's reviewed - Plan: pantoprazole (PROTONIX) 40 MG tablet  Insomnia, unspecified type - intermittent; continue use of ambien prn, especially related to shift-work - Plan: zolpidem (AMBIEN) 10 MG tablet  Benign breast cyst in female, right - slightly larger on exam; confirmed with recent US   Discussed monthly self breast exams and yearly mammograms; at least 30 minutes of aerobic activity at least 5 days/week, weight-bearing exercise 2-3x/week; proper sunscreen use reviewed; healthy diet, including goals of calcium and vitamin D intake and alcohol recommendations (less than or equal to 1 drink/day) reviewed; regular seatbelt use; changing batteries in smoke detectors. Immunizations UTD, continue yearly flu shots. Planning COVID booster, awaiting info re: mixing/matching.  Colonoscopy age 54.  Likely should have EGD at that time as well, sooner if any symptoms suggestive of problems.   Continue long-term PPI, since needed, and continue supplements/vitamins.  F/u 1 year, sooner prn

## 2020-08-21 NOTE — Patient Instructions (Signed)

## 2020-08-23 ENCOUNTER — Ambulatory Visit (INDEPENDENT_AMBULATORY_CARE_PROVIDER_SITE_OTHER): Payer: 59 | Admitting: Family Medicine

## 2020-08-23 ENCOUNTER — Other Ambulatory Visit: Payer: Self-pay | Admitting: Family Medicine

## 2020-08-23 ENCOUNTER — Encounter: Payer: Self-pay | Admitting: Family Medicine

## 2020-08-23 ENCOUNTER — Other Ambulatory Visit: Payer: Self-pay

## 2020-08-23 VITALS — BP 110/60 | HR 75 | Ht 63.5 in | Wt 106.2 lb

## 2020-08-23 DIAGNOSIS — K2960 Other gastritis without bleeding: Secondary | ICD-10-CM

## 2020-08-23 DIAGNOSIS — G4700 Insomnia, unspecified: Secondary | ICD-10-CM | POA: Diagnosis not present

## 2020-08-23 DIAGNOSIS — N6001 Solitary cyst of right breast: Secondary | ICD-10-CM | POA: Diagnosis not present

## 2020-08-23 DIAGNOSIS — Z0000 Encounter for general adult medical examination without abnormal findings: Secondary | ICD-10-CM | POA: Diagnosis not present

## 2020-08-23 DIAGNOSIS — K219 Gastro-esophageal reflux disease without esophagitis: Secondary | ICD-10-CM | POA: Diagnosis not present

## 2020-08-23 DIAGNOSIS — Z5181 Encounter for therapeutic drug level monitoring: Secondary | ICD-10-CM

## 2020-08-23 LAB — CBC WITH DIFFERENTIAL/PLATELET
MCHC: 32.8 g/dL (ref 31.5–35.7)
Platelets: 257 10*3/uL (ref 150–450)

## 2020-08-23 LAB — POCT URINALYSIS DIP (PROADVANTAGE DEVICE)
Bilirubin, UA: NEGATIVE
Blood, UA: NEGATIVE
Glucose, UA: NEGATIVE mg/dL
Ketones, POC UA: NEGATIVE mg/dL
Leukocytes, UA: NEGATIVE
Nitrite, UA: NEGATIVE
Protein Ur, POC: NEGATIVE mg/dL
Specific Gravity, Urine: 1.005
Urobilinogen, Ur: NEGATIVE
pH, UA: 6 (ref 5.0–8.0)

## 2020-08-23 LAB — COMPREHENSIVE METABOLIC PANEL
Alkaline Phosphatase: 48 IU/L (ref 44–121)
Globulin, Total: 2.2 g/dL (ref 1.5–4.5)
Total Protein: 6.6 g/dL (ref 6.0–8.5)

## 2020-08-23 MED ORDER — PANTOPRAZOLE SODIUM 40 MG PO TBEC: 40.0000 mg | DELAYED_RELEASE_TABLET | Freq: Every day | ORAL | 3 refills | Status: DC

## 2020-08-23 MED ORDER — ZOLPIDEM TARTRATE 10 MG PO TABS: ORAL_TABLET | ORAL | 2 refills | Status: DC

## 2020-08-24 LAB — COMPREHENSIVE METABOLIC PANEL
ALT: 9 IU/L (ref 0–32)
AST: 18 IU/L (ref 0–40)
Albumin/Globulin Ratio: 2 (ref 1.2–2.2)
Albumin: 4.4 g/dL (ref 3.8–4.8)
BUN/Creatinine Ratio: 18 (ref 9–23)
BUN: 14 mg/dL (ref 6–24)
Bilirubin Total: 0.6 mg/dL (ref 0.0–1.2)
CO2: 23 mmol/L (ref 20–29)
Calcium: 9.4 mg/dL (ref 8.7–10.2)
Chloride: 102 mmol/L (ref 96–106)
Creatinine, Ser: 0.78 mg/dL (ref 0.57–1.00)
GFR calc Af Amer: 108 mL/min/{1.73_m2} (ref >59)
GFR calc non Af Amer: 93 mL/min/{1.73_m2} (ref >59)
Glucose: 78 mg/dL (ref 65–99)
Potassium: 4.3 mmol/L (ref 3.5–5.2)
Sodium: 137 mmol/L (ref 134–144)

## 2020-08-24 LAB — CBC WITH DIFFERENTIAL/PLATELET
Basophils Absolute: 0.1 10*3/uL (ref 0.0–0.2)
Basos: 1 %
EOS (ABSOLUTE): 0.1 10*3/uL (ref 0.0–0.4)
Eos: 3 %
Hematocrit: 40 % (ref 34.0–46.6)
Hemoglobin: 13.1 g/dL (ref 11.1–15.9)
Immature Grans (Abs): 0 10*3/uL (ref 0.0–0.1)
Immature Granulocytes: 0 %
Lymphocytes Absolute: 1 10*3/uL (ref 0.7–3.1)
Lymphs: 23 %
MCH: 29 pg (ref 26.6–33.0)
MCV: 89 fL (ref 79–97)
Monocytes Absolute: 0.4 10*3/uL (ref 0.1–0.9)
Monocytes: 8 %
Neutrophils Absolute: 2.9 10*3/uL (ref 1.4–7.0)
Neutrophils: 65 %
RBC: 4.51 x10E6/uL (ref 3.77–5.28)
RDW: 12.1 % (ref 11.7–15.4)
WBC: 4.5 10*3/uL (ref 3.4–10.8)

## 2020-08-24 LAB — MAGNESIUM: Magnesium: 2.3 mg/dL (ref 1.6–2.3)

## 2020-08-28 DIAGNOSIS — J3089 Other allergic rhinitis: Secondary | ICD-10-CM | POA: Diagnosis not present

## 2020-08-28 DIAGNOSIS — J301 Allergic rhinitis due to pollen: Secondary | ICD-10-CM | POA: Diagnosis not present

## 2020-08-28 DIAGNOSIS — J3081 Allergic rhinitis due to animal (cat) (dog) hair and dander: Secondary | ICD-10-CM | POA: Diagnosis not present

## 2020-09-04 DIAGNOSIS — J3089 Other allergic rhinitis: Secondary | ICD-10-CM | POA: Diagnosis not present

## 2020-09-04 DIAGNOSIS — J301 Allergic rhinitis due to pollen: Secondary | ICD-10-CM | POA: Diagnosis not present

## 2020-09-04 DIAGNOSIS — J3081 Allergic rhinitis due to animal (cat) (dog) hair and dander: Secondary | ICD-10-CM | POA: Diagnosis not present

## 2020-09-11 DIAGNOSIS — J301 Allergic rhinitis due to pollen: Secondary | ICD-10-CM | POA: Diagnosis not present

## 2020-09-11 DIAGNOSIS — J3081 Allergic rhinitis due to animal (cat) (dog) hair and dander: Secondary | ICD-10-CM | POA: Diagnosis not present

## 2020-09-11 DIAGNOSIS — J3089 Other allergic rhinitis: Secondary | ICD-10-CM | POA: Diagnosis not present

## 2020-09-11 MED FILL — ZOLPIDEM TARTRATE 10 MG TAB: 10 | 30 days supply | Qty: 30 | Fill #0

## 2020-09-20 DIAGNOSIS — J301 Allergic rhinitis due to pollen: Secondary | ICD-10-CM | POA: Diagnosis not present

## 2020-09-20 DIAGNOSIS — J3081 Allergic rhinitis due to animal (cat) (dog) hair and dander: Secondary | ICD-10-CM | POA: Diagnosis not present

## 2020-09-20 DIAGNOSIS — J3089 Other allergic rhinitis: Secondary | ICD-10-CM | POA: Diagnosis not present

## 2020-09-25 DIAGNOSIS — J3089 Other allergic rhinitis: Secondary | ICD-10-CM | POA: Diagnosis not present

## 2020-09-25 DIAGNOSIS — J3081 Allergic rhinitis due to animal (cat) (dog) hair and dander: Secondary | ICD-10-CM | POA: Diagnosis not present

## 2020-09-25 DIAGNOSIS — J301 Allergic rhinitis due to pollen: Secondary | ICD-10-CM | POA: Diagnosis not present

## 2020-10-03 DIAGNOSIS — J3081 Allergic rhinitis due to animal (cat) (dog) hair and dander: Secondary | ICD-10-CM | POA: Diagnosis not present

## 2020-10-03 DIAGNOSIS — J301 Allergic rhinitis due to pollen: Secondary | ICD-10-CM | POA: Diagnosis not present

## 2020-10-03 DIAGNOSIS — J3089 Other allergic rhinitis: Secondary | ICD-10-CM | POA: Diagnosis not present

## 2020-10-09 DIAGNOSIS — J3081 Allergic rhinitis due to animal (cat) (dog) hair and dander: Secondary | ICD-10-CM | POA: Diagnosis not present

## 2020-10-09 DIAGNOSIS — J3089 Other allergic rhinitis: Secondary | ICD-10-CM | POA: Diagnosis not present

## 2020-10-09 DIAGNOSIS — J301 Allergic rhinitis due to pollen: Secondary | ICD-10-CM | POA: Diagnosis not present

## 2020-10-15 DIAGNOSIS — J3089 Other allergic rhinitis: Secondary | ICD-10-CM | POA: Diagnosis not present

## 2020-10-15 DIAGNOSIS — J3081 Allergic rhinitis due to animal (cat) (dog) hair and dander: Secondary | ICD-10-CM | POA: Diagnosis not present

## 2020-10-15 DIAGNOSIS — J301 Allergic rhinitis due to pollen: Secondary | ICD-10-CM | POA: Diagnosis not present

## 2020-10-23 DIAGNOSIS — J3089 Other allergic rhinitis: Secondary | ICD-10-CM | POA: Diagnosis not present

## 2020-10-23 DIAGNOSIS — J301 Allergic rhinitis due to pollen: Secondary | ICD-10-CM | POA: Diagnosis not present

## 2020-10-23 DIAGNOSIS — J3081 Allergic rhinitis due to animal (cat) (dog) hair and dander: Secondary | ICD-10-CM | POA: Diagnosis not present

## 2020-11-01 DIAGNOSIS — J3089 Other allergic rhinitis: Secondary | ICD-10-CM | POA: Diagnosis not present

## 2020-11-01 DIAGNOSIS — J3081 Allergic rhinitis due to animal (cat) (dog) hair and dander: Secondary | ICD-10-CM | POA: Diagnosis not present

## 2020-11-01 DIAGNOSIS — J301 Allergic rhinitis due to pollen: Secondary | ICD-10-CM | POA: Diagnosis not present

## 2020-11-05 DIAGNOSIS — J301 Allergic rhinitis due to pollen: Secondary | ICD-10-CM | POA: Diagnosis not present

## 2020-11-05 DIAGNOSIS — J3081 Allergic rhinitis due to animal (cat) (dog) hair and dander: Secondary | ICD-10-CM | POA: Diagnosis not present

## 2020-11-05 DIAGNOSIS — J3089 Other allergic rhinitis: Secondary | ICD-10-CM | POA: Diagnosis not present

## 2020-11-05 MED FILL — ZOLPIDEM TARTRATE 10 MG TAB: 10 | 30 days supply | Qty: 30 | Fill #1

## 2020-11-05 MED FILL — LEVOCETIRIZINE 5 MG TABLET: 5 | 90 days supply | Qty: 90 | Fill #1

## 2020-11-05 MED FILL — PANTOPRAZOLE SOD DR 40 MG T: 40 | 90 days supply | Qty: 90 | Fill #0

## 2020-11-21 DIAGNOSIS — J3089 Other allergic rhinitis: Secondary | ICD-10-CM | POA: Diagnosis not present

## 2020-11-21 DIAGNOSIS — J3081 Allergic rhinitis due to animal (cat) (dog) hair and dander: Secondary | ICD-10-CM | POA: Diagnosis not present

## 2020-11-21 DIAGNOSIS — J301 Allergic rhinitis due to pollen: Secondary | ICD-10-CM | POA: Diagnosis not present

## 2020-11-28 DIAGNOSIS — J3081 Allergic rhinitis due to animal (cat) (dog) hair and dander: Secondary | ICD-10-CM | POA: Diagnosis not present

## 2020-11-28 DIAGNOSIS — J3089 Other allergic rhinitis: Secondary | ICD-10-CM | POA: Diagnosis not present

## 2020-11-28 DIAGNOSIS — J301 Allergic rhinitis due to pollen: Secondary | ICD-10-CM | POA: Diagnosis not present

## 2020-12-04 DIAGNOSIS — J301 Allergic rhinitis due to pollen: Secondary | ICD-10-CM | POA: Diagnosis not present

## 2020-12-04 DIAGNOSIS — J3081 Allergic rhinitis due to animal (cat) (dog) hair and dander: Secondary | ICD-10-CM | POA: Diagnosis not present

## 2020-12-04 DIAGNOSIS — J3089 Other allergic rhinitis: Secondary | ICD-10-CM | POA: Diagnosis not present

## 2020-12-11 DIAGNOSIS — J3089 Other allergic rhinitis: Secondary | ICD-10-CM | POA: Diagnosis not present

## 2020-12-11 DIAGNOSIS — J3081 Allergic rhinitis due to animal (cat) (dog) hair and dander: Secondary | ICD-10-CM | POA: Diagnosis not present

## 2020-12-11 DIAGNOSIS — J301 Allergic rhinitis due to pollen: Secondary | ICD-10-CM | POA: Diagnosis not present

## 2020-12-19 DIAGNOSIS — J301 Allergic rhinitis due to pollen: Secondary | ICD-10-CM | POA: Diagnosis not present

## 2020-12-19 DIAGNOSIS — J3081 Allergic rhinitis due to animal (cat) (dog) hair and dander: Secondary | ICD-10-CM | POA: Diagnosis not present

## 2020-12-19 DIAGNOSIS — J3089 Other allergic rhinitis: Secondary | ICD-10-CM | POA: Diagnosis not present

## 2020-12-26 DIAGNOSIS — J3081 Allergic rhinitis due to animal (cat) (dog) hair and dander: Secondary | ICD-10-CM | POA: Diagnosis not present

## 2020-12-26 DIAGNOSIS — J301 Allergic rhinitis due to pollen: Secondary | ICD-10-CM | POA: Diagnosis not present

## 2020-12-26 DIAGNOSIS — J3089 Other allergic rhinitis: Secondary | ICD-10-CM | POA: Diagnosis not present

## 2021-01-15 DIAGNOSIS — J301 Allergic rhinitis due to pollen: Secondary | ICD-10-CM | POA: Diagnosis not present

## 2021-01-15 DIAGNOSIS — J3081 Allergic rhinitis due to animal (cat) (dog) hair and dander: Secondary | ICD-10-CM | POA: Diagnosis not present

## 2021-01-15 DIAGNOSIS — J3089 Other allergic rhinitis: Secondary | ICD-10-CM | POA: Diagnosis not present

## 2021-01-22 DIAGNOSIS — J301 Allergic rhinitis due to pollen: Secondary | ICD-10-CM | POA: Diagnosis not present

## 2021-01-22 DIAGNOSIS — J3089 Other allergic rhinitis: Secondary | ICD-10-CM | POA: Diagnosis not present

## 2021-01-22 DIAGNOSIS — J3081 Allergic rhinitis due to animal (cat) (dog) hair and dander: Secondary | ICD-10-CM | POA: Diagnosis not present

## 2021-01-29 DIAGNOSIS — J3089 Other allergic rhinitis: Secondary | ICD-10-CM | POA: Diagnosis not present

## 2021-01-29 DIAGNOSIS — J3081 Allergic rhinitis due to animal (cat) (dog) hair and dander: Secondary | ICD-10-CM | POA: Diagnosis not present

## 2021-01-29 DIAGNOSIS — J301 Allergic rhinitis due to pollen: Secondary | ICD-10-CM | POA: Diagnosis not present

## 2021-02-05 DIAGNOSIS — J3081 Allergic rhinitis due to animal (cat) (dog) hair and dander: Secondary | ICD-10-CM | POA: Diagnosis not present

## 2021-02-05 DIAGNOSIS — J301 Allergic rhinitis due to pollen: Secondary | ICD-10-CM | POA: Diagnosis not present

## 2021-02-05 DIAGNOSIS — J3089 Other allergic rhinitis: Secondary | ICD-10-CM | POA: Diagnosis not present

## 2021-02-11 ENCOUNTER — Other Ambulatory Visit (HOSPITAL_COMMUNITY): Payer: Self-pay

## 2021-02-11 MED FILL — Pantoprazole Sodium EC Tab 40 MG (Base Equiv): ORAL | 90 days supply | Qty: 90 | Fill #0 | Status: AC

## 2021-02-11 MED FILL — Zolpidem Tartrate Tab 10 MG: ORAL | 30 days supply | Qty: 30 | Fill #0 | Status: AC

## 2021-02-11 MED FILL — Levocetirizine Dihydrochloride Tab 5 MG: ORAL | 90 days supply | Qty: 90 | Fill #0 | Status: AC

## 2021-02-12 DIAGNOSIS — J3081 Allergic rhinitis due to animal (cat) (dog) hair and dander: Secondary | ICD-10-CM | POA: Diagnosis not present

## 2021-02-12 DIAGNOSIS — J301 Allergic rhinitis due to pollen: Secondary | ICD-10-CM | POA: Diagnosis not present

## 2021-02-12 DIAGNOSIS — J3089 Other allergic rhinitis: Secondary | ICD-10-CM | POA: Diagnosis not present

## 2021-02-20 DIAGNOSIS — J301 Allergic rhinitis due to pollen: Secondary | ICD-10-CM | POA: Diagnosis not present

## 2021-02-20 DIAGNOSIS — J3081 Allergic rhinitis due to animal (cat) (dog) hair and dander: Secondary | ICD-10-CM | POA: Diagnosis not present

## 2021-02-20 DIAGNOSIS — J3089 Other allergic rhinitis: Secondary | ICD-10-CM | POA: Diagnosis not present

## 2021-02-25 DIAGNOSIS — J3081 Allergic rhinitis due to animal (cat) (dog) hair and dander: Secondary | ICD-10-CM | POA: Diagnosis not present

## 2021-02-25 DIAGNOSIS — J3089 Other allergic rhinitis: Secondary | ICD-10-CM | POA: Diagnosis not present

## 2021-02-25 DIAGNOSIS — J301 Allergic rhinitis due to pollen: Secondary | ICD-10-CM | POA: Diagnosis not present

## 2021-03-04 DIAGNOSIS — J3089 Other allergic rhinitis: Secondary | ICD-10-CM | POA: Diagnosis not present

## 2021-03-04 DIAGNOSIS — J3081 Allergic rhinitis due to animal (cat) (dog) hair and dander: Secondary | ICD-10-CM | POA: Diagnosis not present

## 2021-03-04 DIAGNOSIS — J301 Allergic rhinitis due to pollen: Secondary | ICD-10-CM | POA: Diagnosis not present

## 2021-03-11 DIAGNOSIS — J3089 Other allergic rhinitis: Secondary | ICD-10-CM | POA: Diagnosis not present

## 2021-03-11 DIAGNOSIS — J301 Allergic rhinitis due to pollen: Secondary | ICD-10-CM | POA: Diagnosis not present

## 2021-03-11 DIAGNOSIS — J3081 Allergic rhinitis due to animal (cat) (dog) hair and dander: Secondary | ICD-10-CM | POA: Diagnosis not present

## 2021-03-18 DIAGNOSIS — J3081 Allergic rhinitis due to animal (cat) (dog) hair and dander: Secondary | ICD-10-CM | POA: Diagnosis not present

## 2021-03-18 DIAGNOSIS — J301 Allergic rhinitis due to pollen: Secondary | ICD-10-CM | POA: Diagnosis not present

## 2021-03-18 DIAGNOSIS — J3089 Other allergic rhinitis: Secondary | ICD-10-CM | POA: Diagnosis not present

## 2021-03-21 DIAGNOSIS — Z005 Encounter for examination of potential donor of organ and tissue: Secondary | ICD-10-CM | POA: Diagnosis not present

## 2021-03-27 DIAGNOSIS — J3081 Allergic rhinitis due to animal (cat) (dog) hair and dander: Secondary | ICD-10-CM | POA: Diagnosis not present

## 2021-03-27 DIAGNOSIS — J301 Allergic rhinitis due to pollen: Secondary | ICD-10-CM | POA: Diagnosis not present

## 2021-03-27 DIAGNOSIS — J3089 Other allergic rhinitis: Secondary | ICD-10-CM | POA: Diagnosis not present

## 2021-04-03 ENCOUNTER — Other Ambulatory Visit (HOSPITAL_COMMUNITY): Payer: Self-pay

## 2021-04-03 MED ORDER — CARESTART COVID-19 HOME TEST VI KIT
PACK | 0 refills | Status: DC
  Filled 2021-04-03: qty 4, 4d supply, fill #0

## 2021-04-05 DIAGNOSIS — J3089 Other allergic rhinitis: Secondary | ICD-10-CM | POA: Diagnosis not present

## 2021-04-05 DIAGNOSIS — J3081 Allergic rhinitis due to animal (cat) (dog) hair and dander: Secondary | ICD-10-CM | POA: Diagnosis not present

## 2021-04-05 DIAGNOSIS — J301 Allergic rhinitis due to pollen: Secondary | ICD-10-CM | POA: Diagnosis not present

## 2021-04-12 DIAGNOSIS — J301 Allergic rhinitis due to pollen: Secondary | ICD-10-CM | POA: Diagnosis not present

## 2021-04-12 DIAGNOSIS — J3081 Allergic rhinitis due to animal (cat) (dog) hair and dander: Secondary | ICD-10-CM | POA: Diagnosis not present

## 2021-04-12 DIAGNOSIS — J3089 Other allergic rhinitis: Secondary | ICD-10-CM | POA: Diagnosis not present

## 2021-04-23 DIAGNOSIS — J3089 Other allergic rhinitis: Secondary | ICD-10-CM | POA: Diagnosis not present

## 2021-04-23 DIAGNOSIS — J3081 Allergic rhinitis due to animal (cat) (dog) hair and dander: Secondary | ICD-10-CM | POA: Diagnosis not present

## 2021-04-23 DIAGNOSIS — J301 Allergic rhinitis due to pollen: Secondary | ICD-10-CM | POA: Diagnosis not present

## 2021-04-30 DIAGNOSIS — J3089 Other allergic rhinitis: Secondary | ICD-10-CM | POA: Diagnosis not present

## 2021-04-30 DIAGNOSIS — J301 Allergic rhinitis due to pollen: Secondary | ICD-10-CM | POA: Diagnosis not present

## 2021-04-30 DIAGNOSIS — J3081 Allergic rhinitis due to animal (cat) (dog) hair and dander: Secondary | ICD-10-CM | POA: Diagnosis not present

## 2021-05-08 DIAGNOSIS — J3089 Other allergic rhinitis: Secondary | ICD-10-CM | POA: Diagnosis not present

## 2021-05-08 DIAGNOSIS — J301 Allergic rhinitis due to pollen: Secondary | ICD-10-CM | POA: Diagnosis not present

## 2021-05-08 DIAGNOSIS — J3081 Allergic rhinitis due to animal (cat) (dog) hair and dander: Secondary | ICD-10-CM | POA: Diagnosis not present

## 2021-05-13 DIAGNOSIS — J301 Allergic rhinitis due to pollen: Secondary | ICD-10-CM | POA: Diagnosis not present

## 2021-05-13 DIAGNOSIS — J3081 Allergic rhinitis due to animal (cat) (dog) hair and dander: Secondary | ICD-10-CM | POA: Diagnosis not present

## 2021-05-13 DIAGNOSIS — J3089 Other allergic rhinitis: Secondary | ICD-10-CM | POA: Diagnosis not present

## 2021-05-20 ENCOUNTER — Other Ambulatory Visit (HOSPITAL_COMMUNITY): Payer: Self-pay

## 2021-05-20 DIAGNOSIS — J3081 Allergic rhinitis due to animal (cat) (dog) hair and dander: Secondary | ICD-10-CM | POA: Diagnosis not present

## 2021-05-20 DIAGNOSIS — J301 Allergic rhinitis due to pollen: Secondary | ICD-10-CM | POA: Diagnosis not present

## 2021-05-20 DIAGNOSIS — J3089 Other allergic rhinitis: Secondary | ICD-10-CM | POA: Diagnosis not present

## 2021-05-20 MED ORDER — LEVOCETIRIZINE DIHYDROCHLORIDE 5 MG PO TABS
5.0000 mg | ORAL_TABLET | Freq: Every evening | ORAL | 0 refills | Status: DC
  Filled 2021-05-20: qty 90, 90d supply, fill #0

## 2021-05-21 ENCOUNTER — Other Ambulatory Visit (HOSPITAL_COMMUNITY): Payer: Self-pay

## 2021-05-21 MED FILL — Pantoprazole Sodium EC Tab 40 MG (Base Equiv): ORAL | 90 days supply | Qty: 90 | Fill #1 | Status: AC

## 2021-05-28 DIAGNOSIS — J3081 Allergic rhinitis due to animal (cat) (dog) hair and dander: Secondary | ICD-10-CM | POA: Diagnosis not present

## 2021-05-28 DIAGNOSIS — J3089 Other allergic rhinitis: Secondary | ICD-10-CM | POA: Diagnosis not present

## 2021-05-28 DIAGNOSIS — J301 Allergic rhinitis due to pollen: Secondary | ICD-10-CM | POA: Diagnosis not present

## 2021-06-18 DIAGNOSIS — J301 Allergic rhinitis due to pollen: Secondary | ICD-10-CM | POA: Diagnosis not present

## 2021-06-18 DIAGNOSIS — J3081 Allergic rhinitis due to animal (cat) (dog) hair and dander: Secondary | ICD-10-CM | POA: Diagnosis not present

## 2021-06-18 DIAGNOSIS — J3089 Other allergic rhinitis: Secondary | ICD-10-CM | POA: Diagnosis not present

## 2021-06-24 DIAGNOSIS — J301 Allergic rhinitis due to pollen: Secondary | ICD-10-CM | POA: Diagnosis not present

## 2021-06-24 DIAGNOSIS — J3089 Other allergic rhinitis: Secondary | ICD-10-CM | POA: Diagnosis not present

## 2021-06-24 DIAGNOSIS — J3081 Allergic rhinitis due to animal (cat) (dog) hair and dander: Secondary | ICD-10-CM | POA: Diagnosis not present

## 2021-06-27 DIAGNOSIS — J301 Allergic rhinitis due to pollen: Secondary | ICD-10-CM | POA: Diagnosis not present

## 2021-06-28 DIAGNOSIS — J3081 Allergic rhinitis due to animal (cat) (dog) hair and dander: Secondary | ICD-10-CM | POA: Diagnosis not present

## 2021-06-28 DIAGNOSIS — J3089 Other allergic rhinitis: Secondary | ICD-10-CM | POA: Diagnosis not present

## 2021-07-02 ENCOUNTER — Other Ambulatory Visit: Payer: Self-pay | Admitting: Family Medicine

## 2021-07-02 DIAGNOSIS — J3081 Allergic rhinitis due to animal (cat) (dog) hair and dander: Secondary | ICD-10-CM | POA: Diagnosis not present

## 2021-07-02 DIAGNOSIS — J301 Allergic rhinitis due to pollen: Secondary | ICD-10-CM | POA: Diagnosis not present

## 2021-07-02 DIAGNOSIS — Z1231 Encounter for screening mammogram for malignant neoplasm of breast: Secondary | ICD-10-CM

## 2021-07-02 DIAGNOSIS — J3089 Other allergic rhinitis: Secondary | ICD-10-CM | POA: Diagnosis not present

## 2021-07-10 DIAGNOSIS — J3089 Other allergic rhinitis: Secondary | ICD-10-CM | POA: Diagnosis not present

## 2021-07-10 DIAGNOSIS — J301 Allergic rhinitis due to pollen: Secondary | ICD-10-CM | POA: Diagnosis not present

## 2021-07-10 DIAGNOSIS — J3081 Allergic rhinitis due to animal (cat) (dog) hair and dander: Secondary | ICD-10-CM | POA: Diagnosis not present

## 2021-07-16 DIAGNOSIS — J3081 Allergic rhinitis due to animal (cat) (dog) hair and dander: Secondary | ICD-10-CM | POA: Diagnosis not present

## 2021-07-16 DIAGNOSIS — J301 Allergic rhinitis due to pollen: Secondary | ICD-10-CM | POA: Diagnosis not present

## 2021-07-16 DIAGNOSIS — J3089 Other allergic rhinitis: Secondary | ICD-10-CM | POA: Diagnosis not present

## 2021-07-25 DIAGNOSIS — J3081 Allergic rhinitis due to animal (cat) (dog) hair and dander: Secondary | ICD-10-CM | POA: Diagnosis not present

## 2021-07-25 DIAGNOSIS — J301 Allergic rhinitis due to pollen: Secondary | ICD-10-CM | POA: Diagnosis not present

## 2021-07-25 DIAGNOSIS — J3089 Other allergic rhinitis: Secondary | ICD-10-CM | POA: Diagnosis not present

## 2021-07-31 ENCOUNTER — Encounter: Payer: Self-pay | Admitting: *Deleted

## 2021-08-06 DIAGNOSIS — J3081 Allergic rhinitis due to animal (cat) (dog) hair and dander: Secondary | ICD-10-CM | POA: Diagnosis not present

## 2021-08-06 DIAGNOSIS — J301 Allergic rhinitis due to pollen: Secondary | ICD-10-CM | POA: Diagnosis not present

## 2021-08-06 DIAGNOSIS — J3089 Other allergic rhinitis: Secondary | ICD-10-CM | POA: Diagnosis not present

## 2021-08-09 ENCOUNTER — Other Ambulatory Visit (HOSPITAL_COMMUNITY): Payer: Self-pay

## 2021-08-09 DIAGNOSIS — J3081 Allergic rhinitis due to animal (cat) (dog) hair and dander: Secondary | ICD-10-CM | POA: Diagnosis not present

## 2021-08-09 DIAGNOSIS — J3089 Other allergic rhinitis: Secondary | ICD-10-CM | POA: Diagnosis not present

## 2021-08-09 DIAGNOSIS — J301 Allergic rhinitis due to pollen: Secondary | ICD-10-CM | POA: Diagnosis not present

## 2021-08-09 MED ORDER — EPINEPHRINE 0.3 MG/0.3ML IJ SOAJ
INTRAMUSCULAR | 1 refills | Status: DC
  Filled 2021-08-09: qty 2, 10d supply, fill #0

## 2021-08-09 MED ORDER — LEVOCETIRIZINE DIHYDROCHLORIDE 5 MG PO TABS
5.0000 mg | ORAL_TABLET | Freq: Every evening | ORAL | 2 refills | Status: DC
  Filled 2021-08-09: qty 90, 90d supply, fill #0
  Filled 2021-12-24: qty 90, 90d supply, fill #1
  Filled 2022-03-26: qty 90, 90d supply, fill #2

## 2021-08-09 MED ORDER — AZELASTINE HCL 0.1 % NA SOLN
1.0000 | Freq: Two times a day (BID) | NASAL | 2 refills | Status: DC
  Filled 2021-08-09: qty 90, 75d supply, fill #0

## 2021-08-11 MED FILL — Pantoprazole Sodium EC Tab 40 MG (Base Equiv): ORAL | 90 days supply | Qty: 90 | Fill #2 | Status: AC

## 2021-08-12 ENCOUNTER — Other Ambulatory Visit (HOSPITAL_COMMUNITY): Payer: Self-pay

## 2021-08-12 ENCOUNTER — Ambulatory Visit: Payer: 59

## 2021-08-14 DIAGNOSIS — J3089 Other allergic rhinitis: Secondary | ICD-10-CM | POA: Diagnosis not present

## 2021-08-14 DIAGNOSIS — J301 Allergic rhinitis due to pollen: Secondary | ICD-10-CM | POA: Diagnosis not present

## 2021-08-14 DIAGNOSIS — J3081 Allergic rhinitis due to animal (cat) (dog) hair and dander: Secondary | ICD-10-CM | POA: Diagnosis not present

## 2021-08-19 DIAGNOSIS — J3081 Allergic rhinitis due to animal (cat) (dog) hair and dander: Secondary | ICD-10-CM | POA: Diagnosis not present

## 2021-08-19 DIAGNOSIS — J301 Allergic rhinitis due to pollen: Secondary | ICD-10-CM | POA: Diagnosis not present

## 2021-08-19 DIAGNOSIS — J3089 Other allergic rhinitis: Secondary | ICD-10-CM | POA: Diagnosis not present

## 2021-08-27 DIAGNOSIS — J3081 Allergic rhinitis due to animal (cat) (dog) hair and dander: Secondary | ICD-10-CM | POA: Diagnosis not present

## 2021-08-27 DIAGNOSIS — J3089 Other allergic rhinitis: Secondary | ICD-10-CM | POA: Diagnosis not present

## 2021-08-27 DIAGNOSIS — J301 Allergic rhinitis due to pollen: Secondary | ICD-10-CM | POA: Diagnosis not present

## 2021-09-01 ENCOUNTER — Encounter: Payer: Self-pay | Admitting: Family Medicine

## 2021-09-03 DIAGNOSIS — J301 Allergic rhinitis due to pollen: Secondary | ICD-10-CM | POA: Diagnosis not present

## 2021-09-03 DIAGNOSIS — J3089 Other allergic rhinitis: Secondary | ICD-10-CM | POA: Diagnosis not present

## 2021-09-03 DIAGNOSIS — J3081 Allergic rhinitis due to animal (cat) (dog) hair and dander: Secondary | ICD-10-CM | POA: Diagnosis not present

## 2021-09-03 NOTE — Patient Instructions (Signed)

## 2021-09-03 NOTE — Progress Notes (Signed)
Chief Complaint  Patient presents with   Annual Exam    Nonfasting annual exam with pelvic. Sees eye doctor yearly. Will try to give urine on way out. Would like to wait a few more weeks on covid booster due to work and travel schedule. No new concerns.    Katherine Macias is a 45 y.o. female who presents for a complete physical.    H/o erosive gastritis. She has been taking Protonix daily, longterm without any problems.  She has recurrent symptoms if she misses it for 2-3 days. She has more heartburn the day after coming off night shifts, related to timing of eating and medications. Tums is effective, not needed frequently. Denies dysphagia or chest pain.   Last EGD was 2013, at time of diagnosis.   Insomnia: She uses ambien prn with good results. Last filled #30 in 02/2021 (prior fills were Jan and Nov).  She usually takes 1/2 tablet or less, needing it about 3x/week.  Sleep goes in cycles.  She is working nights now, and sometimes needs it when switching back and forth. She overall uses it less since working 3rd shift (is more tired). She will be switching to 1st and 2nd shifts in 2023.  Allergies:  She is getting allergy shots regularly. She continues on Xyzal, astelin, only uses flonase seasonally.     Immunization History  Administered Date(s) Administered   Influenza Split 08/02/2013, 08/03/2020   Influenza, High Dose Seasonal PF 12/22/2016, 11/08/2018, 08/08/2020, 08/09/2021   PFIZER(Purple Top)SARS-COV-2 Vaccination 10/21/2019, 11/11/2019, 08/08/2020   Td 04/03/2004, 03/12/2015   Tdap 05/29/2005   has gotten Hepatitis A vaccines (no copy of travel vaccines in her chart). gets flu shots yearly through work Last Pap smear: 06/2018, normal, no high risk HPV Last mammogram: yesterday Last colonoscopy: flex sig 07/2012 Last DEXA: never Dentist: every 6 months   Ophtho: goes every 2 years Exercise: 4 days/week (running and biking, body pump classes (wt-bearing exercising 1-2x/week))   Lipids: Lab Results  Component Value Date   CHOL 174 06/17/2018   HDL 94 06/17/2018   LDLCALC 72 06/17/2018   TRIG 38 06/17/2018   CHOLHDL 1.9 06/17/2018    Vitamin D-OH level 41 03/2016 Normal Mg and TSH in 03/2016 Had CBC, c-met 08/2019, normal   Care Everywhere/WF labs reviewed.  She had labs done (as potential kidney donor) in 03/2021: Total Chol 169, LDL 60, TG 68, HDL 91, chol/HDL ratio 1.9; fglu 75, nl glucose tolerance test, nl CBC, PT/PTT, UDS, neg Hep B&C serology, HIV, RPR, CMV   PMH, PSH, SH and FH were reviewed and updated.  Current Outpatient Medications on File Prior to Visit  Medication Sig Dispense Refill   azelastine (ASTELIN) 0.1 % nasal spray Place 1-2 sprays into both nostrils 2 (two) times daily. 90 mL 2   Calcium-Phosphorus-Vitamin D (CALCIUM GUMMIES PO) Take 1 each by mouth daily.     levocetirizine (XYZAL) 5 MG tablet Take 1 tablet (5 mg total) by mouth every evening. 90 tablet 2   melatonin 5 MG TABS Take 5 mg by mouth at bedtime.     Multiple Vitamins-Calcium (ONE-A-DAY WOMENS FORMULA PO) Take 1 tablet by mouth daily.       zolpidem (AMBIEN) 10 MG tablet TAKE 1/2 TO 1 TABLET BY MOUTH AT BEDTIME AS NEEDED FOR INSOMNIA. 30 tablet 2   EPINEPHrine 0.3 mg/0.3 mL IJ SOAJ injection Use as directed as needed for systemic reactions (Patient not taking: Reported on 09/05/2021) 2 each 1  No current facility-administered medications on file prior to visit.   No Known Allergies   ROS: The patient denies anorexia, fever, weight changes, headaches, vision changes, decreased hearing, ear pain, sore throat, breast concerns, chest pain, palpitations, dizziness, syncope, dyspnea on exertion, cough, swelling, vomiting, diarrhea, constipation (occasional), melena, hematochezia, hematuria, incontinence, dysuria, irregular menstrual cycles, vaginal discharge, odor or itch, genital lesions, joint pains, numbness, tingling, weakness, tremor, suspicious skin lesions, depression,  abnormal bleeding/bruising, or enlarged lymph nodes.  Insomnia per HPI. H/o internal hemorrhoid . Occasionally notes blood if she is constipated. Allergies are controlled Reflux is controlled, no dysphagia     PHYSICAL EXAM:  BP 94/64   Pulse 64   Ht $R'5\' 3"'Ar$  (1.6 m)   Wt 107 lb 12.8 oz (48.9 kg)   LMP 08/23/2021 (Approximate)   BMI 19.10 kg/m   Wt Readings from Last 3 Encounters:  09/05/21 107 lb 12.8 oz (48.9 kg)  08/23/20 106 lb 3.2 oz (48.2 kg)  08/04/19 107 lb 6.4 oz (48.7 kg)    General Appearance:    Alert, cooperative, no distress, appears stated age     Head:    Normocephalic, without obvious abnormality, atraumatic     Eyes:    PERRL, conjunctiva/corneas clear, EOM's intact, fundi benign     Ears:    Normal TM's and external ear canals     Nose:    Not examined, wearing mask due to COVID-19 pandemic  Throat:    Not examined, wearing mask due to COVID-19 poandemic   Neck:    Supple; thyroid: no enlargement/tenderness/nodules; no carotid bruit or JVD.  No lymphadenopathy    Back:    Spine nontender, no curvature, ROM normal, no CVA tenderness     Lungs:    Clear to auscultation bilaterally without wheezes, rales or ronchi; respirations unlabored     Chest Wall:    No tenderness or deformity     Heart:    Regular rate and rhythm, S1 and S2 normal, no murmur, rub or gallop     Breast Exam:    No tenderness or nipple discharge or inversion. No axillary lymphadenopathy. Right breast--1.5-2cm cm smooth, mobile, nontender mass as 7-8 o'clock position. This is unchanged from last year. Some fibroglandular changes noted in bilateral breasts in UOQ.  Abdomen:    Soft, non-tender, nondistended, normoactive bowel sounds, no masses, no hepatosplenomegaly     Genitalia:    Normal external genitalia without lesions. BUS and vagina normal; no cervical motion tenderness.  Pap not performed.  Rectal:    Normal sphincter tone, no mass. Heme negative stool  Extremities:    No clubbing,  cyanosis or edema     Pulses:    2+ and symmetric all extremities     Skin:    Skin color, texture, turgor normal, no rashes or lesions     Lymph nodes:    Cervical, supraclavicular, and axillary nodes normal     Neurologic:    Normal strength, sensation and gait; reflexes 2+ and symmetric throughout                          Psych:  Normal mood, affect, hygiene and grooming     ASSESSMENT/PLAN:  Annual physical exam  Gastroesophageal reflux disease, unspecified whether esophagitis present - h/o erosive gastritis. Requires daily PPI for symptom relief - Plan: pantoprazole (PROTONIX) 40 MG tablet  Colon cancer screening - Plan: Ambulatory referral to Gastroenterology  Breast cyst,  right - stable, unchanged. Advised to contact us for diagnostic mammo/US if any change  Discussed monthly self breast exams and yearly mammograms; at least 30 minutes of aerobic activity at least 5 days/week, weight-bearing exercise 2-3x/week; proper sunscreen use reviewed; healthy diet, including goals of calcium and vitamin D intake and alcohol recommendations (less than or equal to 1 drink/day) reviewed; regular seatbelt use; changing batteries in smoke detectors.  Immunizations UTD, continue yearly flu shots. Planning COVID booster, awaiting info re: mixing/matching.  Colonoscopy age 15, refer now.  Likely should have EGD at that time as well, sooner if any symptoms suggestive of problems.    Continue long-term PPI, since needed, and continue supplements/vitamins.  F/u 1 year, sooner prn

## 2021-09-04 ENCOUNTER — Ambulatory Visit
Admission: RE | Admit: 2021-09-04 | Discharge: 2021-09-04 | Disposition: A | Discharge status: Home / Self Care | Payer: 59 | Source: Ambulatory Visit | Attending: Family Medicine | Admitting: Family Medicine

## 2021-09-04 ENCOUNTER — Other Ambulatory Visit: Payer: Self-pay

## 2021-09-04 DIAGNOSIS — Z1231 Encounter for screening mammogram for malignant neoplasm of breast: Secondary | ICD-10-CM | POA: Diagnosis not present

## 2021-09-05 ENCOUNTER — Other Ambulatory Visit (HOSPITAL_COMMUNITY): Payer: Self-pay

## 2021-09-05 ENCOUNTER — Encounter: Payer: Self-pay | Admitting: Family Medicine

## 2021-09-05 ENCOUNTER — Ambulatory Visit (INDEPENDENT_AMBULATORY_CARE_PROVIDER_SITE_OTHER): Payer: 59 | Admitting: Family Medicine

## 2021-09-05 VITALS — BP 94/64 | HR 64 | Ht 63.0 in | Wt 107.8 lb

## 2021-09-05 DIAGNOSIS — K219 Gastro-esophageal reflux disease without esophagitis: Secondary | ICD-10-CM | POA: Diagnosis not present

## 2021-09-05 DIAGNOSIS — Z0000 Encounter for general adult medical examination without abnormal findings: Secondary | ICD-10-CM

## 2021-09-05 DIAGNOSIS — N6001 Solitary cyst of right breast: Secondary | ICD-10-CM

## 2021-09-05 DIAGNOSIS — Z1211 Encounter for screening for malignant neoplasm of colon: Secondary | ICD-10-CM | POA: Diagnosis not present

## 2021-09-05 MED ORDER — PANTOPRAZOLE SODIUM 40 MG PO TBEC
40.0000 mg | DELAYED_RELEASE_TABLET | Freq: Every day | ORAL | 3 refills | Status: DC
  Filled 2021-09-05: qty 90, fill #0
  Filled 2021-12-24: qty 90, 90d supply, fill #0
  Filled 2022-04-13: qty 90, 90d supply, fill #1
  Filled 2022-07-18: qty 90, 90d supply, fill #2

## 2021-09-09 ENCOUNTER — Other Ambulatory Visit (HOSPITAL_BASED_OUTPATIENT_CLINIC_OR_DEPARTMENT_OTHER): Payer: Self-pay

## 2021-09-10 DIAGNOSIS — J3089 Other allergic rhinitis: Secondary | ICD-10-CM | POA: Diagnosis not present

## 2021-09-10 DIAGNOSIS — J3081 Allergic rhinitis due to animal (cat) (dog) hair and dander: Secondary | ICD-10-CM | POA: Diagnosis not present

## 2021-09-10 DIAGNOSIS — J301 Allergic rhinitis due to pollen: Secondary | ICD-10-CM | POA: Diagnosis not present

## 2021-09-19 DIAGNOSIS — J301 Allergic rhinitis due to pollen: Secondary | ICD-10-CM | POA: Diagnosis not present

## 2021-09-19 DIAGNOSIS — J3081 Allergic rhinitis due to animal (cat) (dog) hair and dander: Secondary | ICD-10-CM | POA: Diagnosis not present

## 2021-09-19 DIAGNOSIS — J3089 Other allergic rhinitis: Secondary | ICD-10-CM | POA: Diagnosis not present

## 2021-09-25 DIAGNOSIS — J301 Allergic rhinitis due to pollen: Secondary | ICD-10-CM | POA: Diagnosis not present

## 2021-09-25 DIAGNOSIS — J3081 Allergic rhinitis due to animal (cat) (dog) hair and dander: Secondary | ICD-10-CM | POA: Diagnosis not present

## 2021-09-25 DIAGNOSIS — J3089 Other allergic rhinitis: Secondary | ICD-10-CM | POA: Diagnosis not present

## 2021-10-03 DIAGNOSIS — J3089 Other allergic rhinitis: Secondary | ICD-10-CM | POA: Diagnosis not present

## 2021-10-03 DIAGNOSIS — J3081 Allergic rhinitis due to animal (cat) (dog) hair and dander: Secondary | ICD-10-CM | POA: Diagnosis not present

## 2021-10-03 DIAGNOSIS — J301 Allergic rhinitis due to pollen: Secondary | ICD-10-CM | POA: Diagnosis not present

## 2021-10-11 DIAGNOSIS — J3081 Allergic rhinitis due to animal (cat) (dog) hair and dander: Secondary | ICD-10-CM | POA: Diagnosis not present

## 2021-10-11 DIAGNOSIS — J3089 Other allergic rhinitis: Secondary | ICD-10-CM | POA: Diagnosis not present

## 2021-10-11 DIAGNOSIS — J301 Allergic rhinitis due to pollen: Secondary | ICD-10-CM | POA: Diagnosis not present

## 2021-10-21 DIAGNOSIS — J3089 Other allergic rhinitis: Secondary | ICD-10-CM | POA: Diagnosis not present

## 2021-10-21 DIAGNOSIS — J301 Allergic rhinitis due to pollen: Secondary | ICD-10-CM | POA: Diagnosis not present

## 2021-10-21 DIAGNOSIS — J3081 Allergic rhinitis due to animal (cat) (dog) hair and dander: Secondary | ICD-10-CM | POA: Diagnosis not present

## 2021-11-06 ENCOUNTER — Encounter: Payer: Self-pay | Admitting: Internal Medicine

## 2021-11-28 ENCOUNTER — Other Ambulatory Visit: Payer: Self-pay

## 2021-11-28 ENCOUNTER — Other Ambulatory Visit (HOSPITAL_COMMUNITY): Payer: Self-pay

## 2021-11-28 ENCOUNTER — Ambulatory Visit (AMBULATORY_SURGERY_CENTER): Payer: 59 | Admitting: *Deleted

## 2021-11-28 VITALS — Ht 63.0 in | Wt 110.0 lb

## 2021-11-28 DIAGNOSIS — Z1211 Encounter for screening for malignant neoplasm of colon: Secondary | ICD-10-CM

## 2021-11-28 MED ORDER — NA SULFATE-K SULFATE-MG SULF 17.5-3.13-1.6 GM/177ML PO SOLN
2.0000 | Freq: Once | ORAL | 0 refills | Status: AC
  Filled 2021-11-28: qty 354, 1d supply, fill #0

## 2021-11-28 NOTE — Progress Notes (Signed)

## 2021-12-02 ENCOUNTER — Other Ambulatory Visit (HOSPITAL_COMMUNITY): Payer: Self-pay

## 2021-12-19 ENCOUNTER — Encounter: Payer: Self-pay | Admitting: Internal Medicine

## 2021-12-20 ENCOUNTER — Encounter: Payer: Self-pay | Admitting: Internal Medicine

## 2021-12-20 ENCOUNTER — Ambulatory Visit (AMBULATORY_SURGERY_CENTER): Payer: No Typology Code available for payment source | Admitting: Internal Medicine

## 2021-12-20 VITALS — BP 106/59 | HR 59 | Temp 98.0°F | Resp 12 | Ht 63.0 in | Wt 110.0 lb

## 2021-12-20 DIAGNOSIS — Z1211 Encounter for screening for malignant neoplasm of colon: Secondary | ICD-10-CM | POA: Diagnosis not present

## 2021-12-20 MED ORDER — SODIUM CHLORIDE 0.9 % IV SOLN: 500.0000 mL | INTRAVENOUS | Status: DC

## 2021-12-20 NOTE — Op Note (Signed)
[facility] Patient Name: Katherine Macias Procedure Date: 12/20/2021 1:54 PM MRN: [id number] Endoscopist: Beverley Fiedler , MD Age: 46 Referring MD:  Date of Birth: 28-Apr-1976 Gender: Female Account #: [id number] Procedure:                Colonoscopy Indications:              Screening for colorectal malignant neoplasm Medicines:                Monitored Anesthesia Care Procedure:                Pre-Anesthesia Assessment:                           - Prior to the procedure, a History and Physical                            was performed, and patient medications and                            allergies were reviewed. The patient's tolerance of                            previous anesthesia was also reviewed. The risks                            and benefits of the procedure and the sedation                            options and risks were discussed with the patient.                            All questions were answered, and informed consent                            was obtained. Prior Anticoagulants: The patient has                            taken no previous anticoagulant or antiplatelet                            agents. ASA Grade Assessment: II - A patient with                            mild systemic disease. After reviewing the risks                            and benefits, the patient was deemed in                            satisfactory condition to undergo the procedure.                           After obtaining informed consent, the colonoscope  was passed under direct vision. Throughout the                            procedure, the patient's blood pressure, pulse, and                            oxygen saturations were monitored continuously. The                            PCF-HQ190L Colonoscope was introduced through the                            anus and advanced to the cecum, identified by                            appendiceal orifice and  ileocecal valve. The                            colonoscopy was performed without difficulty. The                            patient tolerated the procedure well. The quality                            of the bowel preparation was good. The ileocecal                            valve, appendiceal orifice, and rectum were                            photographed. Scope In: 2:09:20 PM Scope Out: 2:24:29 PM Scope Withdrawal Time: 0 hours 7 minutes 55 seconds  Total Procedure Duration: 0 hours 15 minutes 9 seconds  Findings:                 The digital rectal exam was normal.                           A single small-mouthed diverticulum was found in                            the hepatic flexure.                           Internal hemorrhoids were found during                            retroflexion. The hemorrhoids were small.                           The exam was otherwise without abnormality. Complications:            No immediate complications. Estimated Blood Loss:     Estimated blood loss: none. Impression:               - Diverticulosis at the hepatic flexure.                           -  Internal hemorrhoids.                           - The examination was otherwise normal.                           - No specimens collected. Recommendation:           - Patient has a contact number available for                            emergencies. The signs and symptoms of potential                            delayed complications were discussed with the                            patient. Return to normal activities tomorrow.                            Written discharge instructions were provided to the                            patient.                           - Resume previous diet.                           - Continue present medications.                           - Repeat colonoscopy in 10 years for screening                            purposes. Beverley Fiedler, MD 12/20/2021 2:26:14  PM This report has been signed electronically.

## 2021-12-20 NOTE — Patient Instructions (Signed)
YOU HAD AN ENDOSCOPIC PROCEDURE TODAY AT THE [facility]:   Refer to the procedure report that was given to you for any specific questions about what was found during the examination.  If the procedure report does not answer your questions, please call your gastroenterologist to clarify.  If you requested that your care partner not be given the details of your procedure findings, then the procedure report has been included in a sealed envelope for you to review at your convenience later.  YOU SHOULD EXPECT: Some feelings of bloating in the abdomen. Passage of more gas than usual.  Walking can help get rid of the air that was put into your GI tract during the procedure and reduce the bloating. If you had a lower endoscopy (such as a colonoscopy or flexible sigmoidoscopy) you may notice spotting of blood in your stool or on the toilet paper. If you underwent a bowel prep for your procedure, you may not have a normal bowel movement for a few days.  Please Note:  You might notice some irritation and congestion in your nose or some drainage.  This is from the oxygen used during your procedure.  There is no need for concern and it should clear up in a day or so.  SYMPTOMS TO REPORT IMMEDIATELY:   Following lower endoscopy (colonoscopy or flexible sigmoidoscopy):  Excessive amounts of blood in the stool  Significant tenderness or worsening of abdominal pains  Swelling of the abdomen that is new, acute  Fever of 100F or higher  For urgent or emergent issues, a gastroenterologist can be reached at any hour by calling [phone]. Do not use MyChart messaging for urgent concerns.    DIET:  We do recommend a small meal at first, but then you may proceed to your regular diet.  Drink plenty of fluids but you should avoid alcoholic beverages for 24 hours.  ACTIVITY:  You should plan to take it easy for the rest of today and you should NOT DRIVE or use heavy machinery until tomorrow (because  of the sedation medicines used during the test).    FOLLOW UP: Our staff will call the number listed on your records 48-72 hours following your procedure to check on you and address any questions or concerns that you may have regarding the information given to you following your procedure. If we do not reach you, we will leave a message.  We will attempt to reach you two times.  During this call, we will ask if you have developed any symptoms of COVID 19. If you develop any symptoms (ie: fever, flu-like symptoms, shortness of breath, cough etc.) before then, please call [phone].  If you test positive for Covid 19 in the 2 weeks post procedure, please call and report this information to us.    If any biopsies were taken you will be contacted by phone or by letter within the next 1-3 weeks.  Please call us at [phone] if you have not heard about the biopsies in 3 weeks.    SIGNATURES/CONFIDENTIALITY: You and/or your care partner have signed paperwork which will be entered into your electronic medical record.  These signatures attest to the fact that that the information above on your After Visit Summary has been reviewed and is understood.  Full responsibility of the confidentiality of this discharge information lies with you and/or your care-partner. 

## 2021-12-20 NOTE — Progress Notes (Signed)
VS by DT    

## 2021-12-20 NOTE — Progress Notes (Signed)
To Pacu, VSS. Report to RN.tb 

## 2021-12-20 NOTE — Progress Notes (Signed)
GASTROENTEROLOGY PROCEDURE H&P NOTE   Primary Care Physician: Rita Ohara, MD    Reason for Procedure:  Colorectal cancer screening  Plan:    Colonoscopy  Patient is appropriate for endoscopic procedure(s) in the ambulatory ([facility]) setting.  The nature of the procedure, as well as the risks, benefits, and alternatives were carefully and thoroughly reviewed with the patient. Ample time for discussion and questions allowed. The patient understood, was satisfied, and agreed to proceed.     HPI: Katherine Macias is a 46 y.o. female who presents for colonoscopy.  Medical history as below.  Tolerated the prep.  No recent chest pain or shortness of breath.  No abdominal pain today.  Past Medical History:  Diagnosis Date   Allergy    tree/dust,cat,dog,feather,mold,grass,weed--on immunotherapy   Erosive gastritis 07/04/2012   Genital warts 05/03/2009   treated with liquid nitrogen   GERD (gastroesophageal reflux disease)    Internal hemorrhoid 07/04/2012   seen on flex sig   Seasonal allergies     Past Surgical History:  Procedure Laterality Date   CERVICAL POLYPECTOMY  04/24/08   benign   ESOPHAGOGASTRODUODENOSCOPY  07/2012   erosive gastritis   FLEXIBLE SIGMOIDOSCOPY  07/2012   internal hemorrhoid   TONSILLECTOMY     WISDOM TOOTH EXTRACTION      Prior to Admission medications   Medication Sig Start Date End Date Taking? Authorizing Provider  azelastine (ASTELIN) 0.1 % nasal spray Place 1-2 sprays into both nostrils 2 (two) times daily. 08/09/21  Yes   Calcium-Phosphorus-Vitamin D (CALCIUM GUMMIES PO) Take 1 each by mouth daily.   Yes [provider]  levocetirizine (XYZAL) 5 MG tablet Take 1 tablet (5 mg total) by mouth every evening. 08/09/21  Yes   Multiple Vitamins-Calcium (ONE-A-DAY WOMENS FORMULA PO) Take 1 tablet by mouth daily.     Yes [provider]  pantoprazole (PROTONIX) 40 MG tablet TAKE 1 TABLET (40 MG TOTAL) BY MOUTH DAILY. 09/05/21 09/05/22 Yes  Rita Ohara, MD  EPINEPHrine 0.3 mg/0.3 mL IJ SOAJ injection Use as directed as needed for systemic reactions Patient not taking: Reported on 12/20/2021 08/09/21     melatonin 5 MG TABS Take 5 mg by mouth at bedtime. Patient not taking: Reported on 12/20/2021    [provider]  zolpidem (AMBIEN) 10 MG tablet TAKE 1/2 TO 1 TABLET BY MOUTH AT BEDTIME AS NEEDED FOR INSOMNIA. 08/23/20 09/05/21  Rita Ohara, MD    Current Outpatient Medications  Medication Sig Dispense Refill   azelastine (ASTELIN) 0.1 % nasal spray Place 1-2 sprays into both nostrils 2 (two) times daily. 90 mL 2   Calcium-Phosphorus-Vitamin D (CALCIUM GUMMIES PO) Take 1 each by mouth daily.     levocetirizine (XYZAL) 5 MG tablet Take 1 tablet (5 mg total) by mouth every evening. 90 tablet 2   Multiple Vitamins-Calcium (ONE-A-DAY WOMENS FORMULA PO) Take 1 tablet by mouth daily.       pantoprazole (PROTONIX) 40 MG tablet TAKE 1 TABLET (40 MG TOTAL) BY MOUTH DAILY. 90 tablet 3   EPINEPHrine 0.3 mg/0.3 mL IJ SOAJ injection Use as directed as needed for systemic reactions (Patient not taking: Reported on 12/20/2021) 2 each 1   melatonin 5 MG TABS Take 5 mg by mouth at bedtime. (Patient not taking: Reported on 12/20/2021)     zolpidem (AMBIEN) 10 MG tablet TAKE 1/2 TO 1 TABLET BY MOUTH AT BEDTIME AS NEEDED FOR INSOMNIA. 30 tablet 2   Current Facility-Administered Medications  Medication Dose  Route Frequency Provider Last Rate Last Admin   0.9 %  sodium chloride infusion  500 mL Intravenous Continuous Dereck Agerton, Lajuan Lines, MD        Allergies as of 12/20/2021   (No Known Allergies)    Family History  Problem Relation Age of Onset   Hypertension Mother    Breast cancer Mother 60       BRCA neg   Hypertension Father    Hypertension Brother    Breast cancer Paternal Aunt    Breast cancer Paternal Aunt    Diabetes Paternal Grandmother    Breast cancer Cousin 6       BRCA+   Colon cancer Neg Hx    Colon polyps Neg Hx     Esophageal cancer Neg Hx    Stomach cancer Neg Hx    Rectal cancer Neg Hx     Social History   Socioeconomic History   Marital status: Married    Spouse name: Not on file   Number of children: 0   Years of education: Not on file   Highest education level: Not on file  Occupational History   Occupation: Marine scientist: [employer]  Tobacco Use   Smoking status: Never   Smokeless tobacco: Never  Vaping Use   Vaping Use: Never used  Substance and Sexual Activity   Alcohol use: Yes    Comment: 3-4/week   Drug use: No   Sexual activity: Yes    Birth control/protection: Other-see comments    Comment: husband s/p vasectomy  Other Topics Concern   Not on file  Social History Narrative   Married. 2 stepchildren (live with their mom, college-age).  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      Updated 08/2020   Social Determinants of Health   Financial Resource Strain: Not on file  Food Insecurity: Not on file  Transportation Needs: Not on file  Physical Activity: Not on file  Stress: Not on file  Social Connections: Not on file  Intimate Partner Violence: Not on file    Physical Exam: Vital signs in last 24 hours: $RemoveB'@BP'pRozJJUE$  112/70    Pulse 70    Temp 98 F (36.7 C)    Ht $R'5\' 3"'ex$  (1.6 m)    Wt 110 lb (49.9 kg)    SpO2 100%    BMI 19.49 kg/m  GEN: NAD EYE: Sclerae anicteric ENT: MMM CV: Non-tachycardic Pulm: CTA b/l GI: Soft, NT/ND NEURO:  Alert & Oriented x 3   Zenovia Jarred, MD [facility]  12/20/2021 1:57 PM

## 2021-12-24 ENCOUNTER — Other Ambulatory Visit (HOSPITAL_COMMUNITY): Payer: Self-pay

## 2021-12-25 ENCOUNTER — Telehealth: Payer: Self-pay

## 2021-12-25 NOTE — Telephone Encounter (Signed)
°  Follow up Call-  Call back number 12/20/2021  Post procedure Call Back phone  # [phone]  Permission to leave phone message Yes  Some recent data might be hidden    NO ANSWER, MESSAGE LEFT FOR PATIENT.

## 2021-12-25 NOTE — Telephone Encounter (Signed)
Attempted f/u call back. No answer, left VM. 

## 2022-01-20 ENCOUNTER — Other Ambulatory Visit: Payer: Self-pay | Admitting: Family Medicine

## 2022-01-20 DIAGNOSIS — G4700 Insomnia, unspecified: Secondary | ICD-10-CM

## 2022-01-21 ENCOUNTER — Other Ambulatory Visit (HOSPITAL_COMMUNITY): Payer: Self-pay

## 2022-01-21 MED ORDER — ZOLPIDEM TARTRATE 10 MG PO TABS
5.0000 mg | ORAL_TABLET | Freq: Every evening | ORAL | 2 refills | Status: DC
  Filled 2022-01-21: qty 30, 30d supply, fill #0
  Filled 2022-04-13: qty 30, 30d supply, fill #1
  Filled 2022-07-18: qty 30, 30d supply, fill #2

## 2022-03-27 ENCOUNTER — Other Ambulatory Visit (HOSPITAL_COMMUNITY): Payer: Self-pay

## 2022-04-14 ENCOUNTER — Other Ambulatory Visit (HOSPITAL_COMMUNITY): Payer: Self-pay

## 2022-06-24 ENCOUNTER — Other Ambulatory Visit (HOSPITAL_COMMUNITY): Payer: Self-pay

## 2022-06-25 ENCOUNTER — Other Ambulatory Visit (HOSPITAL_COMMUNITY): Payer: Self-pay

## 2022-06-25 MED ORDER — LEVOCETIRIZINE DIHYDROCHLORIDE 5 MG PO TABS
5.0000 mg | ORAL_TABLET | Freq: Every evening | ORAL | 0 refills | Status: DC
  Filled 2022-06-25 – 2022-07-03 (×2): qty 90, 90d supply, fill #0

## 2022-07-03 ENCOUNTER — Other Ambulatory Visit (HOSPITAL_COMMUNITY): Payer: Self-pay

## 2022-07-09 ENCOUNTER — Encounter: Payer: Self-pay | Admitting: Internal Medicine

## 2022-07-18 ENCOUNTER — Other Ambulatory Visit (HOSPITAL_COMMUNITY): Payer: Self-pay

## 2022-08-06 ENCOUNTER — Other Ambulatory Visit (HOSPITAL_COMMUNITY): Payer: Self-pay

## 2022-08-06 ENCOUNTER — Other Ambulatory Visit: Payer: Self-pay | Admitting: Family Medicine

## 2022-08-06 DIAGNOSIS — Z1231 Encounter for screening mammogram for malignant neoplasm of breast: Secondary | ICD-10-CM

## 2022-08-06 MED ORDER — LEVOCETIRIZINE DIHYDROCHLORIDE 5 MG PO TABS
5.0000 mg | ORAL_TABLET | Freq: Every evening | ORAL | 2 refills | Status: DC
  Filled 2022-08-06 – 2022-09-26 (×2): qty 90, 90d supply, fill #0
  Filled 2022-12-22: qty 90, 90d supply, fill #1
  Filled 2023-03-30: qty 90, 90d supply, fill #2

## 2022-08-06 MED ORDER — EPINEPHRINE 0.3 MG/0.3ML IJ SOAJ
INTRAMUSCULAR | 1 refills | Status: AC
Start: 2022-08-06 — End: ?
  Filled 2022-08-06: qty 2, 2d supply, fill #0

## 2022-08-08 ENCOUNTER — Other Ambulatory Visit (HOSPITAL_COMMUNITY): Payer: Self-pay

## 2022-08-08 MED ORDER — AZITHROMYCIN 500 MG PO TABS
ORAL_TABLET | ORAL | 0 refills | Status: DC
  Filled 2022-08-08: qty 4, 3d supply, fill #0

## 2022-08-08 MED ORDER — ATOVAQUONE-PROGUANIL HCL 250-100 MG PO TABS
1.0000 | ORAL_TABLET | Freq: Every day | ORAL | 0 refills | Status: DC
  Filled 2022-08-08: qty 25, 25d supply, fill #0

## 2022-08-12 ENCOUNTER — Encounter: Payer: Self-pay | Admitting: Internal Medicine

## 2022-09-11 ENCOUNTER — Encounter: Payer: 59 | Admitting: Family Medicine

## 2022-09-22 ENCOUNTER — Ambulatory Visit: Payer: No Typology Code available for payment source

## 2022-09-26 ENCOUNTER — Other Ambulatory Visit (HOSPITAL_COMMUNITY): Payer: Self-pay

## 2022-09-30 ENCOUNTER — Encounter: Payer: Self-pay | Admitting: Family Medicine

## 2022-10-16 ENCOUNTER — Ambulatory Visit: Payer: No Typology Code available for payment source

## 2022-10-17 ENCOUNTER — Ambulatory Visit
Admission: RE | Admit: 2022-10-17 | Discharge: 2022-10-17 | Disposition: A | Discharge status: Home / Self Care | Payer: No Typology Code available for payment source | Source: Ambulatory Visit | Attending: Family Medicine | Admitting: Family Medicine

## 2022-10-17 DIAGNOSIS — Z1231 Encounter for screening mammogram for malignant neoplasm of breast: Secondary | ICD-10-CM

## 2022-11-01 ENCOUNTER — Encounter: Payer: Self-pay | Admitting: Family Medicine

## 2022-11-04 NOTE — Patient Instructions (Signed)

## 2022-11-04 NOTE — Progress Notes (Unsigned)
No chief complaint on file.  Katherine Macias is a 47 y.o. female who presents for a complete physical.    She had "[place] belly" in November, treated with azithro, but had some prolonged changes in bowel.  Per MyChart message, advised to take a probiotic, and she reports her GI issues resolved.  H/o erosive gastritis. She has been taking Protonix daily, longterm without any problems.  She has recurrent symptoms if she misses it for 2-3 days. She has more heartburn the day after coming off night shifts, related to timing of eating and medications. Tums is effective, not needed frequently. Denies dysphagia or chest pain.   Last EGD was 2013, at time of diagnosis.   Insomnia: She uses ambien prn with good results. Last filled #30 in 07/2022, with prior fills in June and March (q3 mos).   She usually takes 1/2 tablet or less, needing it about 3x/week.  Sleep goes in cycles.  She is working nights now, and sometimes needs it when switching back and forth. She overall uses it less when working 3rd shift (is more tired), more when working 1st and 2nd shifts. UPDATE  Allergies:  She is getting allergy shots regularly. She continues on Xyzal, astelin, only uses flonase seasonally.     Immunization History  Administered Date(s) Administered   Influenza Split 08/02/2013, 08/03/2020   Influenza, High Dose Seasonal PF 12/22/2016, 11/08/2018, 08/08/2020, 08/09/2021   PFIZER(Purple Top)SARS-COV-2 Vaccination 10/21/2019, 11/11/2019, 08/08/2020   Td 04/03/2004, 03/12/2015   Tdap 05/29/2005   has gotten Hepatitis A vaccines (no copy of travel vaccines in her chart). gets flu shots yearly through work Last Pap smear: 06/2018, normal, no high risk HPV Last mammogram: 10/2022 Last colonoscopy: 12/2021 with Dr. Hilarie Fredrickson. Small internal hemorrhoids, single diverticulum in hepatic flexure Last DEXA: never Dentist: every 6 months   Ophtho: goes every 2 years Exercise: 4 days/week (running and biking, body pump  classes (wt-bearing exercising 1-2x/week))  Lipids: Lab Results  Component Value Date   CHOL 174 06/17/2018   HDL 94 06/17/2018   LDLCALC 72 06/17/2018   TRIG 38 06/17/2018   CHOLHDL 1.9 06/17/2018    Vitamin D-OH level 41 03/2016 Normal Mg and TSH in 03/2016 Had CBC, c-met 08/2019, normal   Care Everywhere/WF labs reviewed.  She had labs done (as potential kidney donor) in 03/2021: Total Chol 169, LDL 60, TG 68, HDL 91, chol/HDL ratio 1.9; fglu 75, nl glucose tolerance test, nl CBC, PT/PTT, UDS, neg Hep B&C serology, HIV, RPR, CMV   PMH, PSH, SH and FH were reviewed and updated.    ROS: The patient denies anorexia, fever, weight changes, headaches, vision changes, decreased hearing, ear pain, sore throat, breast concerns, chest pain, palpitations, dizziness, syncope, dyspnea on exertion, cough, swelling, vomiting, diarrhea, constipation (occasional), melena, hematochezia, hematuria, incontinence, dysuria, irregular menstrual cycles, vaginal discharge, odor or itch, genital lesions, joint pains, numbness, tingling, weakness, tremor, suspicious skin lesions, depression, abnormal bleeding/bruising, or enlarged lymph nodes.  Insomnia per HPI. H/o internal hemorrhoid . Occasionally notes blood if she is constipated. Allergies are controlled Reflux is controlled, no dysphagia     PHYSICAL EXAM:  LMP 10/09/2022   Wt Readings from Last 3 Encounters:  12/20/21 110 lb (49.9 kg)  11/28/21 110 lb (49.9 kg)  09/05/21 107 lb 12.8 oz (48.9 kg)    General Appearance:    Alert, cooperative, no distress, appears stated age     Head:    Normocephalic, without obvious abnormality, atraumatic  Eyes:    PERRL, conjunctiva/corneas clear, EOM's intact, fundi benign     Ears:    Normal TM's and external ear canals     Nose:    Not examined, wearing mask due to COVID-19 pandemic  Throat:    Not examined, wearing mask due to COVID-19 pandemic   Neck:    Supple; thyroid: no  enlargement/tenderness/nodules; no carotid bruit or JVD.  No lymphadenopathy    Back:    Spine nontender, no curvature, ROM normal, no CVA tenderness     Lungs:    Clear to auscultation bilaterally without wheezes, rales or ronchi; respirations unlabored     Chest Wall:    No tenderness or deformity     Heart:    Regular rate and rhythm, S1 and S2 normal, no murmur, rub or gallop     Breast Exam:    No tenderness or nipple discharge or inversion. No axillary lymphadenopathy. Right breast--1.5-2cm cm smooth, mobile, nontender mass as 7-8 o'clock position. This is unchanged from last year. Some fibroglandular changes noted in bilateral breasts in UOQ.  Abdomen:    Soft, non-tender, nondistended, normoactive bowel sounds, no masses, no hepatosplenomegaly     Genitalia:    Normal external genitalia without lesions. BUS and vagina normal; no cervical motion tenderness, no abnormal discharge, no cervical lesions.  Pap performed.  Rectal:    Normal sphincter tone, no mass. Heme negative stool  Extremities:    No clubbing, cyanosis or edema     Pulses:    2+ and symmetric all extremities     Skin:    Skin color, texture, turgor normal, no rashes or lesions     Lymph nodes:    Cervical, supraclavicular, and axillary nodes normal     Neurologic:    Normal strength, sensation and gait; reflexes 2+ and symmetric throughout                          Psych:  Normal mood, affect, hygiene and grooming    ***update if not wearing mask UPDATE BREAST EXAM, known right breast cyst   ASSESSMENT/PLAN:  Enter flu shot Enter/offer/decline COVID booster  Pap today  Discussed monthly self breast exams and yearly mammograms; at least 30 minutes of aerobic activity at least 5 days/week, weight-bearing exercise 2-3x/week; proper sunscreen use reviewed; healthy diet, including goals of calcium and vitamin D intake and alcohol recommendations (less than or equal to 1 drink/day) reviewed; regular seatbelt use; changing  batteries in smoke detectors.  Immunizations UTD, continue yearly flu shots.  COVID booster Colonoscopy UTD. Continue long-term PPI, since needed, and continue supplements/vitamins.  F/u 1 year, sooner prn

## 2022-11-05 ENCOUNTER — Ambulatory Visit (INDEPENDENT_AMBULATORY_CARE_PROVIDER_SITE_OTHER): Payer: 59 | Admitting: Family Medicine

## 2022-11-05 ENCOUNTER — Other Ambulatory Visit (HOSPITAL_COMMUNITY)
Admission: RE | Admit: 2022-11-05 | Discharge: 2022-11-05 | Disposition: A | Discharge status: Home / Self Care | Payer: 59 | Source: Ambulatory Visit | Attending: Family Medicine | Admitting: Family Medicine

## 2022-11-05 ENCOUNTER — Encounter: Payer: Self-pay | Admitting: Family Medicine

## 2022-11-05 ENCOUNTER — Other Ambulatory Visit (HOSPITAL_COMMUNITY): Payer: Self-pay

## 2022-11-05 VITALS — BP 100/60 | HR 64 | Ht 64.0 in | Wt 110.6 lb

## 2022-11-05 DIAGNOSIS — J3089 Other allergic rhinitis: Secondary | ICD-10-CM | POA: Diagnosis not present

## 2022-11-05 DIAGNOSIS — J301 Allergic rhinitis due to pollen: Secondary | ICD-10-CM | POA: Diagnosis not present

## 2022-11-05 DIAGNOSIS — J3081 Allergic rhinitis due to animal (cat) (dog) hair and dander: Secondary | ICD-10-CM | POA: Diagnosis not present

## 2022-11-05 DIAGNOSIS — G4700 Insomnia, unspecified: Secondary | ICD-10-CM | POA: Diagnosis not present

## 2022-11-05 DIAGNOSIS — Z0000 Encounter for general adult medical examination without abnormal findings: Secondary | ICD-10-CM

## 2022-11-05 DIAGNOSIS — K219 Gastro-esophageal reflux disease without esophagitis: Secondary | ICD-10-CM

## 2022-11-05 LAB — POCT URINALYSIS DIP (PROADVANTAGE DEVICE)
Bilirubin, UA: NEGATIVE
Blood, UA: NEGATIVE
Glucose, UA: NEGATIVE mg/dL
Ketones, POC UA: NEGATIVE mg/dL
Leukocytes, UA: NEGATIVE
Nitrite, UA: NEGATIVE
Protein Ur, POC: NEGATIVE mg/dL
Specific Gravity, Urine: 1.015
Urobilinogen, Ur: 0.2
pH, UA: 6 (ref 5.0–8.0)

## 2022-11-05 MED ORDER — PANTOPRAZOLE SODIUM 40 MG PO TBEC
40.0000 mg | DELAYED_RELEASE_TABLET | Freq: Every day | ORAL | 3 refills | Status: DC
  Filled 2022-11-05: qty 90, 90d supply, fill #0
  Filled 2023-01-31: qty 90, 90d supply, fill #1
  Filled 2023-05-12: qty 90, 90d supply, fill #2
  Filled 2023-09-01: qty 90, 90d supply, fill #3

## 2022-11-10 DIAGNOSIS — J3089 Other allergic rhinitis: Secondary | ICD-10-CM | POA: Diagnosis not present

## 2022-11-10 DIAGNOSIS — J3081 Allergic rhinitis due to animal (cat) (dog) hair and dander: Secondary | ICD-10-CM | POA: Diagnosis not present

## 2022-11-10 DIAGNOSIS — J301 Allergic rhinitis due to pollen: Secondary | ICD-10-CM | POA: Diagnosis not present

## 2022-11-11 LAB — CYTOLOGY - PAP
Adequacy: ABSENT
Comment: NEGATIVE
Diagnosis: NEGATIVE
Diagnosis: REACTIVE
High risk HPV: NEGATIVE

## 2022-11-13 DIAGNOSIS — M542 Cervicalgia: Secondary | ICD-10-CM | POA: Diagnosis not present

## 2022-11-19 DIAGNOSIS — J3081 Allergic rhinitis due to animal (cat) (dog) hair and dander: Secondary | ICD-10-CM | POA: Diagnosis not present

## 2022-11-19 DIAGNOSIS — J3089 Other allergic rhinitis: Secondary | ICD-10-CM | POA: Diagnosis not present

## 2022-11-19 DIAGNOSIS — J301 Allergic rhinitis due to pollen: Secondary | ICD-10-CM | POA: Diagnosis not present

## 2022-11-24 DIAGNOSIS — M542 Cervicalgia: Secondary | ICD-10-CM | POA: Diagnosis not present

## 2022-11-26 DIAGNOSIS — J3081 Allergic rhinitis due to animal (cat) (dog) hair and dander: Secondary | ICD-10-CM | POA: Diagnosis not present

## 2022-11-26 DIAGNOSIS — J301 Allergic rhinitis due to pollen: Secondary | ICD-10-CM | POA: Diagnosis not present

## 2022-11-26 DIAGNOSIS — J3089 Other allergic rhinitis: Secondary | ICD-10-CM | POA: Diagnosis not present

## 2022-12-01 DIAGNOSIS — M542 Cervicalgia: Secondary | ICD-10-CM | POA: Diagnosis not present

## 2022-12-03 DIAGNOSIS — J3089 Other allergic rhinitis: Secondary | ICD-10-CM | POA: Diagnosis not present

## 2022-12-03 DIAGNOSIS — J301 Allergic rhinitis due to pollen: Secondary | ICD-10-CM | POA: Diagnosis not present

## 2022-12-03 DIAGNOSIS — J3081 Allergic rhinitis due to animal (cat) (dog) hair and dander: Secondary | ICD-10-CM | POA: Diagnosis not present

## 2022-12-10 DIAGNOSIS — J301 Allergic rhinitis due to pollen: Secondary | ICD-10-CM | POA: Diagnosis not present

## 2022-12-10 DIAGNOSIS — J3081 Allergic rhinitis due to animal (cat) (dog) hair and dander: Secondary | ICD-10-CM | POA: Diagnosis not present

## 2022-12-10 DIAGNOSIS — J3089 Other allergic rhinitis: Secondary | ICD-10-CM | POA: Diagnosis not present

## 2022-12-15 DIAGNOSIS — M542 Cervicalgia: Secondary | ICD-10-CM | POA: Diagnosis not present

## 2022-12-17 DIAGNOSIS — J3081 Allergic rhinitis due to animal (cat) (dog) hair and dander: Secondary | ICD-10-CM | POA: Diagnosis not present

## 2022-12-17 DIAGNOSIS — J301 Allergic rhinitis due to pollen: Secondary | ICD-10-CM | POA: Diagnosis not present

## 2022-12-17 DIAGNOSIS — J3089 Other allergic rhinitis: Secondary | ICD-10-CM | POA: Diagnosis not present

## 2022-12-22 ENCOUNTER — Other Ambulatory Visit (HOSPITAL_COMMUNITY): Payer: Self-pay

## 2022-12-24 DIAGNOSIS — J3081 Allergic rhinitis due to animal (cat) (dog) hair and dander: Secondary | ICD-10-CM | POA: Diagnosis not present

## 2022-12-24 DIAGNOSIS — J301 Allergic rhinitis due to pollen: Secondary | ICD-10-CM | POA: Diagnosis not present

## 2022-12-24 DIAGNOSIS — J3089 Other allergic rhinitis: Secondary | ICD-10-CM | POA: Diagnosis not present

## 2023-01-07 DIAGNOSIS — J301 Allergic rhinitis due to pollen: Secondary | ICD-10-CM | POA: Diagnosis not present

## 2023-01-07 DIAGNOSIS — J3081 Allergic rhinitis due to animal (cat) (dog) hair and dander: Secondary | ICD-10-CM | POA: Diagnosis not present

## 2023-01-07 DIAGNOSIS — J3089 Other allergic rhinitis: Secondary | ICD-10-CM | POA: Diagnosis not present

## 2023-01-14 DIAGNOSIS — J3089 Other allergic rhinitis: Secondary | ICD-10-CM | POA: Diagnosis not present

## 2023-01-14 DIAGNOSIS — J301 Allergic rhinitis due to pollen: Secondary | ICD-10-CM | POA: Diagnosis not present

## 2023-01-14 DIAGNOSIS — J3081 Allergic rhinitis due to animal (cat) (dog) hair and dander: Secondary | ICD-10-CM | POA: Diagnosis not present

## 2023-01-21 DIAGNOSIS — J301 Allergic rhinitis due to pollen: Secondary | ICD-10-CM | POA: Diagnosis not present

## 2023-01-21 DIAGNOSIS — J3089 Other allergic rhinitis: Secondary | ICD-10-CM | POA: Diagnosis not present

## 2023-01-21 DIAGNOSIS — J3081 Allergic rhinitis due to animal (cat) (dog) hair and dander: Secondary | ICD-10-CM | POA: Diagnosis not present

## 2023-01-29 DIAGNOSIS — J301 Allergic rhinitis due to pollen: Secondary | ICD-10-CM | POA: Diagnosis not present

## 2023-01-29 DIAGNOSIS — J3081 Allergic rhinitis due to animal (cat) (dog) hair and dander: Secondary | ICD-10-CM | POA: Diagnosis not present

## 2023-01-29 DIAGNOSIS — J3089 Other allergic rhinitis: Secondary | ICD-10-CM | POA: Diagnosis not present

## 2023-02-04 DIAGNOSIS — J301 Allergic rhinitis due to pollen: Secondary | ICD-10-CM | POA: Diagnosis not present

## 2023-02-04 DIAGNOSIS — J3089 Other allergic rhinitis: Secondary | ICD-10-CM | POA: Diagnosis not present

## 2023-02-04 DIAGNOSIS — J3081 Allergic rhinitis due to animal (cat) (dog) hair and dander: Secondary | ICD-10-CM | POA: Diagnosis not present

## 2023-02-11 DIAGNOSIS — J301 Allergic rhinitis due to pollen: Secondary | ICD-10-CM | POA: Diagnosis not present

## 2023-02-11 DIAGNOSIS — J3081 Allergic rhinitis due to animal (cat) (dog) hair and dander: Secondary | ICD-10-CM | POA: Diagnosis not present

## 2023-02-11 DIAGNOSIS — J3089 Other allergic rhinitis: Secondary | ICD-10-CM | POA: Diagnosis not present

## 2023-02-18 DIAGNOSIS — J3089 Other allergic rhinitis: Secondary | ICD-10-CM | POA: Diagnosis not present

## 2023-02-18 DIAGNOSIS — J301 Allergic rhinitis due to pollen: Secondary | ICD-10-CM | POA: Diagnosis not present

## 2023-02-18 DIAGNOSIS — J3081 Allergic rhinitis due to animal (cat) (dog) hair and dander: Secondary | ICD-10-CM | POA: Diagnosis not present

## 2023-02-24 DIAGNOSIS — J301 Allergic rhinitis due to pollen: Secondary | ICD-10-CM | POA: Diagnosis not present

## 2023-02-25 DIAGNOSIS — J3089 Other allergic rhinitis: Secondary | ICD-10-CM | POA: Diagnosis not present

## 2023-02-25 DIAGNOSIS — J3081 Allergic rhinitis due to animal (cat) (dog) hair and dander: Secondary | ICD-10-CM | POA: Diagnosis not present

## 2023-02-26 DIAGNOSIS — J3089 Other allergic rhinitis: Secondary | ICD-10-CM | POA: Diagnosis not present

## 2023-02-26 DIAGNOSIS — J3081 Allergic rhinitis due to animal (cat) (dog) hair and dander: Secondary | ICD-10-CM | POA: Diagnosis not present

## 2023-02-26 DIAGNOSIS — J301 Allergic rhinitis due to pollen: Secondary | ICD-10-CM | POA: Diagnosis not present

## 2023-03-05 DIAGNOSIS — J301 Allergic rhinitis due to pollen: Secondary | ICD-10-CM | POA: Diagnosis not present

## 2023-03-05 DIAGNOSIS — J3089 Other allergic rhinitis: Secondary | ICD-10-CM | POA: Diagnosis not present

## 2023-03-05 DIAGNOSIS — J3081 Allergic rhinitis due to animal (cat) (dog) hair and dander: Secondary | ICD-10-CM | POA: Diagnosis not present

## 2023-03-11 DIAGNOSIS — J3081 Allergic rhinitis due to animal (cat) (dog) hair and dander: Secondary | ICD-10-CM | POA: Diagnosis not present

## 2023-03-11 DIAGNOSIS — J3089 Other allergic rhinitis: Secondary | ICD-10-CM | POA: Diagnosis not present

## 2023-03-11 DIAGNOSIS — J301 Allergic rhinitis due to pollen: Secondary | ICD-10-CM | POA: Diagnosis not present

## 2023-03-20 DIAGNOSIS — J3089 Other allergic rhinitis: Secondary | ICD-10-CM | POA: Diagnosis not present

## 2023-03-20 DIAGNOSIS — J301 Allergic rhinitis due to pollen: Secondary | ICD-10-CM | POA: Diagnosis not present

## 2023-03-20 DIAGNOSIS — J3081 Allergic rhinitis due to animal (cat) (dog) hair and dander: Secondary | ICD-10-CM | POA: Diagnosis not present

## 2023-03-25 DIAGNOSIS — J3089 Other allergic rhinitis: Secondary | ICD-10-CM | POA: Diagnosis not present

## 2023-03-25 DIAGNOSIS — J301 Allergic rhinitis due to pollen: Secondary | ICD-10-CM | POA: Diagnosis not present

## 2023-03-25 DIAGNOSIS — J3081 Allergic rhinitis due to animal (cat) (dog) hair and dander: Secondary | ICD-10-CM | POA: Diagnosis not present

## 2023-03-31 DIAGNOSIS — J301 Allergic rhinitis due to pollen: Secondary | ICD-10-CM | POA: Diagnosis not present

## 2023-03-31 DIAGNOSIS — J3089 Other allergic rhinitis: Secondary | ICD-10-CM | POA: Diagnosis not present

## 2023-03-31 DIAGNOSIS — J3081 Allergic rhinitis due to animal (cat) (dog) hair and dander: Secondary | ICD-10-CM | POA: Diagnosis not present

## 2023-04-10 DIAGNOSIS — J3089 Other allergic rhinitis: Secondary | ICD-10-CM | POA: Diagnosis not present

## 2023-04-10 DIAGNOSIS — J3081 Allergic rhinitis due to animal (cat) (dog) hair and dander: Secondary | ICD-10-CM | POA: Diagnosis not present

## 2023-04-10 DIAGNOSIS — J301 Allergic rhinitis due to pollen: Secondary | ICD-10-CM | POA: Diagnosis not present

## 2023-04-16 DIAGNOSIS — J3089 Other allergic rhinitis: Secondary | ICD-10-CM | POA: Diagnosis not present

## 2023-04-16 DIAGNOSIS — J3081 Allergic rhinitis due to animal (cat) (dog) hair and dander: Secondary | ICD-10-CM | POA: Diagnosis not present

## 2023-04-16 DIAGNOSIS — J301 Allergic rhinitis due to pollen: Secondary | ICD-10-CM | POA: Diagnosis not present

## 2023-04-22 DIAGNOSIS — J3089 Other allergic rhinitis: Secondary | ICD-10-CM | POA: Diagnosis not present

## 2023-04-22 DIAGNOSIS — J3081 Allergic rhinitis due to animal (cat) (dog) hair and dander: Secondary | ICD-10-CM | POA: Diagnosis not present

## 2023-04-22 DIAGNOSIS — J301 Allergic rhinitis due to pollen: Secondary | ICD-10-CM | POA: Diagnosis not present

## 2023-04-29 DIAGNOSIS — J3089 Other allergic rhinitis: Secondary | ICD-10-CM | POA: Diagnosis not present

## 2023-04-29 DIAGNOSIS — J3081 Allergic rhinitis due to animal (cat) (dog) hair and dander: Secondary | ICD-10-CM | POA: Diagnosis not present

## 2023-04-29 DIAGNOSIS — J301 Allergic rhinitis due to pollen: Secondary | ICD-10-CM | POA: Diagnosis not present

## 2023-05-11 DIAGNOSIS — J3081 Allergic rhinitis due to animal (cat) (dog) hair and dander: Secondary | ICD-10-CM | POA: Diagnosis not present

## 2023-05-11 DIAGNOSIS — J301 Allergic rhinitis due to pollen: Secondary | ICD-10-CM | POA: Diagnosis not present

## 2023-05-11 DIAGNOSIS — J3089 Other allergic rhinitis: Secondary | ICD-10-CM | POA: Diagnosis not present

## 2023-05-12 ENCOUNTER — Other Ambulatory Visit (HOSPITAL_COMMUNITY): Payer: Self-pay

## 2023-05-12 MED ORDER — AZELASTINE HCL 0.1 % NA SOLN
NASAL | 0 refills | Status: DC
  Filled 2023-05-12: qty 90, 75d supply, fill #0

## 2023-05-20 ENCOUNTER — Encounter (HOSPITAL_COMMUNITY): Payer: Self-pay

## 2023-05-20 ENCOUNTER — Other Ambulatory Visit (HOSPITAL_COMMUNITY): Payer: Self-pay

## 2023-05-22 DIAGNOSIS — J3081 Allergic rhinitis due to animal (cat) (dog) hair and dander: Secondary | ICD-10-CM | POA: Diagnosis not present

## 2023-05-22 DIAGNOSIS — J301 Allergic rhinitis due to pollen: Secondary | ICD-10-CM | POA: Diagnosis not present

## 2023-05-22 DIAGNOSIS — J3089 Other allergic rhinitis: Secondary | ICD-10-CM | POA: Diagnosis not present

## 2023-05-28 DIAGNOSIS — J301 Allergic rhinitis due to pollen: Secondary | ICD-10-CM | POA: Diagnosis not present

## 2023-05-28 DIAGNOSIS — J3081 Allergic rhinitis due to animal (cat) (dog) hair and dander: Secondary | ICD-10-CM | POA: Diagnosis not present

## 2023-05-28 DIAGNOSIS — J3089 Other allergic rhinitis: Secondary | ICD-10-CM | POA: Diagnosis not present

## 2023-06-04 DIAGNOSIS — J3089 Other allergic rhinitis: Secondary | ICD-10-CM | POA: Diagnosis not present

## 2023-06-04 DIAGNOSIS — J3081 Allergic rhinitis due to animal (cat) (dog) hair and dander: Secondary | ICD-10-CM | POA: Diagnosis not present

## 2023-06-04 DIAGNOSIS — J301 Allergic rhinitis due to pollen: Secondary | ICD-10-CM | POA: Diagnosis not present

## 2023-06-08 DIAGNOSIS — H524 Presbyopia: Secondary | ICD-10-CM | POA: Diagnosis not present

## 2023-06-11 DIAGNOSIS — J3081 Allergic rhinitis due to animal (cat) (dog) hair and dander: Secondary | ICD-10-CM | POA: Diagnosis not present

## 2023-06-11 DIAGNOSIS — J3089 Other allergic rhinitis: Secondary | ICD-10-CM | POA: Diagnosis not present

## 2023-06-11 DIAGNOSIS — J301 Allergic rhinitis due to pollen: Secondary | ICD-10-CM | POA: Diagnosis not present

## 2023-06-19 DIAGNOSIS — J3081 Allergic rhinitis due to animal (cat) (dog) hair and dander: Secondary | ICD-10-CM | POA: Diagnosis not present

## 2023-06-19 DIAGNOSIS — J301 Allergic rhinitis due to pollen: Secondary | ICD-10-CM | POA: Diagnosis not present

## 2023-06-19 DIAGNOSIS — J3089 Other allergic rhinitis: Secondary | ICD-10-CM | POA: Diagnosis not present

## 2023-06-26 DIAGNOSIS — J3081 Allergic rhinitis due to animal (cat) (dog) hair and dander: Secondary | ICD-10-CM | POA: Diagnosis not present

## 2023-06-26 DIAGNOSIS — J3089 Other allergic rhinitis: Secondary | ICD-10-CM | POA: Diagnosis not present

## 2023-06-26 DIAGNOSIS — J301 Allergic rhinitis due to pollen: Secondary | ICD-10-CM | POA: Diagnosis not present

## 2023-07-01 ENCOUNTER — Other Ambulatory Visit (HOSPITAL_COMMUNITY): Payer: Self-pay

## 2023-07-01 MED ORDER — LEVOCETIRIZINE DIHYDROCHLORIDE 5 MG PO TABS
5.0000 mg | ORAL_TABLET | Freq: Every evening | ORAL | 0 refills | Status: DC
  Filled 2023-07-01: qty 90, 90d supply, fill #0

## 2023-07-02 ENCOUNTER — Other Ambulatory Visit (HOSPITAL_COMMUNITY): Payer: Self-pay

## 2023-07-02 DIAGNOSIS — J3081 Allergic rhinitis due to animal (cat) (dog) hair and dander: Secondary | ICD-10-CM | POA: Diagnosis not present

## 2023-07-02 DIAGNOSIS — J301 Allergic rhinitis due to pollen: Secondary | ICD-10-CM | POA: Diagnosis not present

## 2023-07-02 DIAGNOSIS — J3089 Other allergic rhinitis: Secondary | ICD-10-CM | POA: Diagnosis not present

## 2023-07-08 DIAGNOSIS — J3089 Other allergic rhinitis: Secondary | ICD-10-CM | POA: Diagnosis not present

## 2023-07-08 DIAGNOSIS — J301 Allergic rhinitis due to pollen: Secondary | ICD-10-CM | POA: Diagnosis not present

## 2023-07-08 DIAGNOSIS — J3081 Allergic rhinitis due to animal (cat) (dog) hair and dander: Secondary | ICD-10-CM | POA: Diagnosis not present

## 2023-07-16 DIAGNOSIS — J3089 Other allergic rhinitis: Secondary | ICD-10-CM | POA: Diagnosis not present

## 2023-07-16 DIAGNOSIS — J301 Allergic rhinitis due to pollen: Secondary | ICD-10-CM | POA: Diagnosis not present

## 2023-07-16 DIAGNOSIS — J3081 Allergic rhinitis due to animal (cat) (dog) hair and dander: Secondary | ICD-10-CM | POA: Diagnosis not present

## 2023-07-27 DIAGNOSIS — J301 Allergic rhinitis due to pollen: Secondary | ICD-10-CM | POA: Diagnosis not present

## 2023-07-27 DIAGNOSIS — J3081 Allergic rhinitis due to animal (cat) (dog) hair and dander: Secondary | ICD-10-CM | POA: Diagnosis not present

## 2023-07-27 DIAGNOSIS — J3089 Other allergic rhinitis: Secondary | ICD-10-CM | POA: Diagnosis not present

## 2023-08-03 DIAGNOSIS — J3089 Other allergic rhinitis: Secondary | ICD-10-CM | POA: Diagnosis not present

## 2023-08-03 DIAGNOSIS — J301 Allergic rhinitis due to pollen: Secondary | ICD-10-CM | POA: Diagnosis not present

## 2023-08-03 DIAGNOSIS — J3081 Allergic rhinitis due to animal (cat) (dog) hair and dander: Secondary | ICD-10-CM | POA: Diagnosis not present

## 2023-08-21 ENCOUNTER — Other Ambulatory Visit (HOSPITAL_COMMUNITY): Payer: Self-pay

## 2023-08-21 DIAGNOSIS — J301 Allergic rhinitis due to pollen: Secondary | ICD-10-CM | POA: Diagnosis not present

## 2023-08-21 DIAGNOSIS — J3089 Other allergic rhinitis: Secondary | ICD-10-CM | POA: Diagnosis not present

## 2023-08-21 DIAGNOSIS — J3081 Allergic rhinitis due to animal (cat) (dog) hair and dander: Secondary | ICD-10-CM | POA: Diagnosis not present

## 2023-08-21 MED ORDER — LEVOCETIRIZINE DIHYDROCHLORIDE 5 MG PO TABS
5.0000 mg | ORAL_TABLET | Freq: Every evening | ORAL | 2 refills | Status: DC
  Filled 2023-10-04: qty 90, 90d supply, fill #0
  Filled 2023-12-31: qty 90, 90d supply, fill #1
  Filled 2024-04-07: qty 90, 90d supply, fill #2

## 2023-08-26 DIAGNOSIS — J3089 Other allergic rhinitis: Secondary | ICD-10-CM | POA: Diagnosis not present

## 2023-08-26 DIAGNOSIS — J3081 Allergic rhinitis due to animal (cat) (dog) hair and dander: Secondary | ICD-10-CM | POA: Diagnosis not present

## 2023-08-26 DIAGNOSIS — J301 Allergic rhinitis due to pollen: Secondary | ICD-10-CM | POA: Diagnosis not present

## 2023-09-01 DIAGNOSIS — J301 Allergic rhinitis due to pollen: Secondary | ICD-10-CM | POA: Diagnosis not present

## 2023-09-02 DIAGNOSIS — J3081 Allergic rhinitis due to animal (cat) (dog) hair and dander: Secondary | ICD-10-CM | POA: Diagnosis not present

## 2023-09-02 DIAGNOSIS — J301 Allergic rhinitis due to pollen: Secondary | ICD-10-CM | POA: Diagnosis not present

## 2023-09-02 DIAGNOSIS — J3089 Other allergic rhinitis: Secondary | ICD-10-CM | POA: Diagnosis not present

## 2023-09-15 DIAGNOSIS — J3081 Allergic rhinitis due to animal (cat) (dog) hair and dander: Secondary | ICD-10-CM | POA: Diagnosis not present

## 2023-09-15 DIAGNOSIS — J301 Allergic rhinitis due to pollen: Secondary | ICD-10-CM | POA: Diagnosis not present

## 2023-09-15 DIAGNOSIS — H5213 Myopia, bilateral: Secondary | ICD-10-CM | POA: Diagnosis not present

## 2023-09-15 DIAGNOSIS — G43109 Migraine with aura, not intractable, without status migrainosus: Secondary | ICD-10-CM | POA: Diagnosis not present

## 2023-09-15 DIAGNOSIS — H524 Presbyopia: Secondary | ICD-10-CM | POA: Diagnosis not present

## 2023-09-15 DIAGNOSIS — J3089 Other allergic rhinitis: Secondary | ICD-10-CM | POA: Diagnosis not present

## 2023-09-18 ENCOUNTER — Other Ambulatory Visit (HOSPITAL_COMMUNITY): Payer: Self-pay

## 2023-09-18 ENCOUNTER — Other Ambulatory Visit: Payer: Self-pay | Admitting: Family Medicine

## 2023-09-18 DIAGNOSIS — G4700 Insomnia, unspecified: Secondary | ICD-10-CM

## 2023-09-18 MED ORDER — ZOLPIDEM TARTRATE 10 MG PO TABS
5.0000 mg | ORAL_TABLET | Freq: Every evening | ORAL | 0 refills | Status: DC
  Filled 2023-09-18: qty 30, 30d supply, fill #0

## 2023-10-04 ENCOUNTER — Other Ambulatory Visit (HOSPITAL_COMMUNITY): Payer: Self-pay

## 2023-10-05 DIAGNOSIS — J301 Allergic rhinitis due to pollen: Secondary | ICD-10-CM | POA: Diagnosis not present

## 2023-10-05 DIAGNOSIS — J3081 Allergic rhinitis due to animal (cat) (dog) hair and dander: Secondary | ICD-10-CM | POA: Diagnosis not present

## 2023-10-05 DIAGNOSIS — J3089 Other allergic rhinitis: Secondary | ICD-10-CM | POA: Diagnosis not present

## 2023-10-13 ENCOUNTER — Other Ambulatory Visit: Payer: Self-pay | Admitting: Family Medicine

## 2023-10-13 DIAGNOSIS — Z1231 Encounter for screening mammogram for malignant neoplasm of breast: Secondary | ICD-10-CM

## 2023-10-22 DIAGNOSIS — J3081 Allergic rhinitis due to animal (cat) (dog) hair and dander: Secondary | ICD-10-CM | POA: Diagnosis not present

## 2023-10-22 DIAGNOSIS — J301 Allergic rhinitis due to pollen: Secondary | ICD-10-CM | POA: Diagnosis not present

## 2023-10-22 DIAGNOSIS — J3089 Other allergic rhinitis: Secondary | ICD-10-CM | POA: Diagnosis not present

## 2023-11-09 DIAGNOSIS — Z1231 Encounter for screening mammogram for malignant neoplasm of breast: Secondary | ICD-10-CM

## 2023-11-10 DIAGNOSIS — J301 Allergic rhinitis due to pollen: Secondary | ICD-10-CM | POA: Diagnosis not present

## 2023-11-10 DIAGNOSIS — J3081 Allergic rhinitis due to animal (cat) (dog) hair and dander: Secondary | ICD-10-CM | POA: Diagnosis not present

## 2023-11-10 DIAGNOSIS — J3089 Other allergic rhinitis: Secondary | ICD-10-CM | POA: Diagnosis not present

## 2023-11-12 ENCOUNTER — Encounter: Payer: 59 | Admitting: Family Medicine

## 2023-11-16 DIAGNOSIS — J3089 Other allergic rhinitis: Secondary | ICD-10-CM | POA: Diagnosis not present

## 2023-11-16 DIAGNOSIS — J3081 Allergic rhinitis due to animal (cat) (dog) hair and dander: Secondary | ICD-10-CM | POA: Diagnosis not present

## 2023-11-16 DIAGNOSIS — J301 Allergic rhinitis due to pollen: Secondary | ICD-10-CM | POA: Diagnosis not present

## 2023-11-25 DIAGNOSIS — J301 Allergic rhinitis due to pollen: Secondary | ICD-10-CM | POA: Diagnosis not present

## 2023-11-25 DIAGNOSIS — J3089 Other allergic rhinitis: Secondary | ICD-10-CM | POA: Diagnosis not present

## 2023-11-25 DIAGNOSIS — J3081 Allergic rhinitis due to animal (cat) (dog) hair and dander: Secondary | ICD-10-CM | POA: Diagnosis not present

## 2023-11-27 ENCOUNTER — Encounter: Payer: Self-pay | Admitting: Family Medicine

## 2023-12-02 ENCOUNTER — Ambulatory Visit
Admission: RE | Admit: 2023-12-02 | Discharge: 2023-12-02 | Disposition: A | Discharge status: Home / Self Care | Payer: 59 | Source: Ambulatory Visit | Attending: Family Medicine | Admitting: Family Medicine

## 2023-12-02 DIAGNOSIS — J3089 Other allergic rhinitis: Secondary | ICD-10-CM | POA: Diagnosis not present

## 2023-12-02 DIAGNOSIS — Z1231 Encounter for screening mammogram for malignant neoplasm of breast: Secondary | ICD-10-CM | POA: Diagnosis not present

## 2023-12-02 DIAGNOSIS — J3081 Allergic rhinitis due to animal (cat) (dog) hair and dander: Secondary | ICD-10-CM | POA: Diagnosis not present

## 2023-12-02 DIAGNOSIS — J301 Allergic rhinitis due to pollen: Secondary | ICD-10-CM | POA: Diagnosis not present

## 2023-12-02 NOTE — Progress Notes (Unsigned)
No chief complaint on file.  Patient sent the following message 11/27/23 (never forwarded to me): "You may remember that I was worked up back in May 2022 for a possible kidney donation that didn't end up happening. I am now being worked up again - this time it is for a recipient in [place] at the [facility]. They do not do remote donations so I have been there once for all my pre-transplant evaluations. One of the tests that they did was a abd/chest CT and I have attached the results of that here. The kidney surgeon is not too worried about the nodules seen near my thyroid gland but in order to dot all of i's and cross all of t's, they would like me to get an ultrasound of my thyroid. I do not want to travel back to [place] to get this test done. Are you able to refer and order this for me locally? The transplant team coordinator's name is Mardene Celeste and her phone number is [phone] if you have further questions."    Records reviewed:  CT chest 11/12/23: lmpression 1. Prominent nodular structures along the dorsaI aspect of the thyroid gland bilateralty measuring 7 mm on the left and 5 mm on the right, potentially reflecting parathyroid glands atthough with parathyroid adenoma not exctuded as wetl. 2. Normat-appearing kidneys with conventional renal, collecting system and ureteral anatomy.  Also reviewed labs in CareEverywhere (extensive workup done, essentially normal; no thyroid testing done).   PMH, PSH, SH reviewed   ROS:   PHYSICAL EXAM:  There were no vitals taken for this visit.  Wt Readings from Last 3 Encounters:  11/05/22 110 lb 9.6 oz (50.2 kg)  12/20/21 110 lb (49.9 kg)  11/28/21 110 lb (49.9 kg)     ASSESSMENT/PLAN:   Thyroid US--thyroid nodules noted on CT done elsewhere 11/12/23

## 2023-12-03 ENCOUNTER — Encounter: Payer: Self-pay | Admitting: Family Medicine

## 2023-12-03 ENCOUNTER — Other Ambulatory Visit (HOSPITAL_COMMUNITY): Payer: Self-pay

## 2023-12-03 ENCOUNTER — Ambulatory Visit: Payer: 59 | Admitting: Family Medicine

## 2023-12-03 VITALS — BP 100/60 | HR 64 | Ht 63.0 in | Wt 112.0 lb

## 2023-12-03 DIAGNOSIS — K219 Gastro-esophageal reflux disease without esophagitis: Secondary | ICD-10-CM

## 2023-12-03 DIAGNOSIS — E042 Nontoxic multinodular goiter: Secondary | ICD-10-CM | POA: Diagnosis not present

## 2023-12-03 MED ORDER — PANTOPRAZOLE SODIUM 40 MG PO TBEC
40.0000 mg | DELAYED_RELEASE_TABLET | Freq: Every day | ORAL | 3 refills | Status: DC
  Filled 2023-12-03: qty 90, 90d supply, fill #0

## 2023-12-08 ENCOUNTER — Ambulatory Visit (HOSPITAL_BASED_OUTPATIENT_CLINIC_OR_DEPARTMENT_OTHER): Payer: 59

## 2023-12-12 ENCOUNTER — Other Ambulatory Visit (HOSPITAL_COMMUNITY): Payer: Self-pay

## 2023-12-14 ENCOUNTER — Other Ambulatory Visit (HOSPITAL_COMMUNITY): Payer: Self-pay

## 2023-12-14 DIAGNOSIS — J3081 Allergic rhinitis due to animal (cat) (dog) hair and dander: Secondary | ICD-10-CM | POA: Diagnosis not present

## 2023-12-14 DIAGNOSIS — J301 Allergic rhinitis due to pollen: Secondary | ICD-10-CM | POA: Diagnosis not present

## 2023-12-14 DIAGNOSIS — J3089 Other allergic rhinitis: Secondary | ICD-10-CM | POA: Diagnosis not present

## 2023-12-14 MED ORDER — AZELASTINE HCL 137 MCG/SPRAY NA SOLN
1.0000 | Freq: Two times a day (BID) | NASAL | 5 refills | Status: AC
  Filled 2024-03-24: qty 90, 75d supply, fill #1
  Filled 2024-10-03 (×2): qty 90, 75d supply, fill #2

## 2023-12-15 ENCOUNTER — Ambulatory Visit (HOSPITAL_BASED_OUTPATIENT_CLINIC_OR_DEPARTMENT_OTHER)
Admission: RE | Admit: 2023-12-15 | Discharge: 2023-12-15 | Disposition: A | Discharge status: Home / Self Care | Payer: 59 | Source: Ambulatory Visit | Attending: Family Medicine | Admitting: Family Medicine

## 2023-12-15 DIAGNOSIS — E042 Nontoxic multinodular goiter: Secondary | ICD-10-CM | POA: Insufficient documentation

## 2023-12-21 DIAGNOSIS — J301 Allergic rhinitis due to pollen: Secondary | ICD-10-CM | POA: Diagnosis not present

## 2023-12-21 DIAGNOSIS — J3081 Allergic rhinitis due to animal (cat) (dog) hair and dander: Secondary | ICD-10-CM | POA: Diagnosis not present

## 2023-12-21 DIAGNOSIS — J3089 Other allergic rhinitis: Secondary | ICD-10-CM | POA: Diagnosis not present

## 2023-12-22 ENCOUNTER — Encounter: Payer: Self-pay | Admitting: Family Medicine

## 2023-12-23 ENCOUNTER — Encounter: Payer: Self-pay | Admitting: Family Medicine

## 2023-12-28 ENCOUNTER — Encounter: Payer: 59 | Admitting: Family Medicine

## 2023-12-30 NOTE — Patient Instructions (Incomplete)
  HEALTH MAINTENANCE RECOMMENDATIONS:  It is recommended that you get at least 30 minutes of aerobic exercise at least 5 days/week (for weight loss, you may need as much as 60-90 minutes). This can be any activity that gets your heart rate up. This can be divided in 10-15 minute intervals if needed, but try and build up your endurance at least once a week.  Weight bearing exercise is also recommended twice weekly.  Eat a healthy diet with lots of vegetables, fruits and fiber.  "Colorful" foods have a lot of vitamins (ie green vegetables, tomatoes, red peppers, etc).  Limit sweet tea, regular sodas and alcoholic beverages, all of which has a lot of calories and sugar.  Up to 1 alcoholic drink daily may be beneficial for women (unless trying to lose weight, watch sugars).  Drink a lot of water.  Calcium recommendations are 1200-1500 mg daily (1500 mg for postmenopausal women or women without ovaries), and vitamin D 1000 IU daily.  This should be obtained from diet and/or supplements (vitamins), and calcium should not be taken all at once, but in divided doses.  Monthly self breast exams and yearly mammograms for women over the age of 33 is recommended.  Sunscreen of at least SPF 30 should be used on all sun-exposed parts of the skin when outside between the hours of 10 am and 4 pm (not just when at beach or pool, but even with exercise, golf, tennis, and yard work!)  Use a sunscreen that says "broad spectrum" so it covers both UVA and UVB rays, and make sure to reapply every 1-2 hours.  Remember to change the batteries in your smoke detectors when changing your clock times in the spring and fall. Carbon monoxide detectors are recommended for your home.  Use your seat belt every time you are in a car, and please drive safely and not be distracted with cell phones and texting while driving.  Consider trying an over-the-counter dose of PPI (ie prilosec OTC or Nexium 24) daily, in place of the prescription  protonix.  You can double up on the medication prior to a triggering meal, if needed.  If this doesn't control your reflux adequately, go back to the protonix daily.

## 2023-12-30 NOTE — Progress Notes (Unsigned)
 No chief complaint on file.  Katherine Macias is a 48 y.o. female who presents for a complete physical.    She was approved to be a living kidney donor.   She recently had thyroid US to f/u on findings from CT scan. Thyroid US 12/15/23: IMPRESSION: Left lower gland TI-RADS category 3 nodule measures 1.4 cm. This lesion does not meet criteria to recommend biopsy or further imaging surveillance. No further imaging follow-up is recommended.   H/o erosive gastritis. She has been taking Protonix daily, longterm without any problems.  She has recurrent symptoms if she misses it for 2-3 days. She also previously noted  more heartburn the day after coming off night shifts, related to timing of eating and medications. She now works second shifts, so this isn't a factor. Denies dysphagia or chest pain.   Last EGD was 2013, at time of diagnosis.   Insomnia: She uses ambien prn with good results. She has been getting it filled #30 every 2-3 months, with fills in June, September, and November. She usually takes 1/2 tablet or less, needing it about 3x/week.  Sleep goes in cycles.   Currently takes it about 3x/week, sometimes she will go a week without any. Denies unusual behaviors.  Allergies:  She is getting allergy shots regularly. She continues on Xyzal, astelin.  Only rarely uses flonase.    Immunization History  Administered Date(s) Administered   Influenza Split 08/02/2013, 08/03/2020   Influenza, High Dose Seasonal PF 12/22/2016, 11/08/2018, 08/08/2020, 08/09/2021   Influenza-Unspecified 08/01/2022, 08/24/2023   PFIZER(Purple Top)SARS-COV-2 Vaccination 10/21/2019, 11/11/2019, 08/08/2020   Pfizer(Comirnaty)Fall Seasonal Vaccine 12 years and older 08/13/2022   Td 04/03/2004, 03/12/2015   Tdap 05/29/2005   has gotten Hepatitis A vaccines (no copy of travel vaccines in her chart). gets flu shots yearly through work Last Pap smear: 11/2022, normal, no high risk HPV Last mammogram: 11/2023 Last  colonoscopy: 12/2021 with Dr. Rhea Belton. Small internal hemorrhoids, single diverticulum in hepatic flexure Last DEXA: never Dentist: every 6 months   Ophtho: goes every 2 years Exercise:   4 days/week (running and biking, body pump classes (wt-bearing exercising 1-2x/week))  Occasionally uses some dumbbells at home  Lipids: Lipid Panel 11/2023: Order: [id number] Component Ref Range & Units 1 mo ago  Cholesterol <200 mg/dL 409  HDL Cholesterol 40 - 59 mg/dL 92 High   LDL Cholesterol Calculated <130 mg/dL 57  Triglycerides <811 mg/dL 72  Non-HDL Calculated <160 71  Chol/HDL Ratio 0.00 - 4.40 1.77   Had labs 11/2023 (as potential kidney donor)--negative Hepatitis studies, HIV, CBC, PTT, PT/INR and others. C-met (AST borderline at 45, rest normal).     PMH, PSH, SH and FH were reviewed and updated.    ROS: The patient denies anorexia, fever, weight changes, headaches, vision changes, decreased hearing, ear pain, sore throat, breast concerns, chest pain, palpitations, dizziness, syncope, dyspnea on exertion, cough, swelling, vomiting, diarrhea, constipation, melena, hematochezia (rare, if hemorrhoid flares), hematuria, incontinence, dysuria, irregular menstrual cycles (skipped one month earlier this year, normal since), vaginal discharge, odor or itch, genital lesions, joint pains, numbness, tingling, weakness, tremor, suspicious skin lesions, depression, abnormal bleeding/bruising, or enlarged lymph nodes.  Insomnia per HPI. H/o internal hemorrhoid . Occasionally notes blood if she is constipated. Allergies are controlled Reflux is controlled, no dysphagia     PHYSICAL EXAM:  LMP 11/21/2023   Wt Readings from Last 3 Encounters:  12/03/23 112 lb (50.8 kg)  11/05/22 110 lb 9.6 oz (50.2 kg)  12/20/21 110 lb (  49.9 kg)    General Appearance:    Alert, cooperative, no distress, appears stated age     Head:    Normocephalic, without obvious abnormality, atraumatic     Eyes:     PERRL, conjunctiva/corneas clear, EOM's intact, fundi benign     Ears:    Normal TM's and external ear canals     Nose:    No drainage, sinuses nontender  Throat:    Normal mucosa  Neck:    Supple; thyroid: no enlargement/tenderness/nodules; no carotid bruit or JVD.  No lymphadenopathy    Back:    Spine nontender, no curvature, ROM normal, no CVA tenderness     Lungs:    Clear to auscultation bilaterally without wheezes, rales or ronchi; respirations unlabored     Chest Wall:    No tenderness or deformity     Heart:    Regular rate and rhythm, S1 and S2 normal, no murmur, rub or gallop     Breast Exam:    No tenderness or nipple discharge or inversion. No axillary lymphadenopathy. Some fibroglandular changes noted in bilateral breasts in UOQ.   Abdomen:    Soft, non-tender, nondistended, normoactive bowel sounds, no masses, no hepatosplenomegaly     Genitalia:    Normal external genitalia without lesions. BUS and vagina normal; no cervical motion tenderness, no abnormal discharge. Pap not performed.  Rectal:    Normal sphincter tone, no mass. Heme negative stool  Extremities:    No clubbing, cyanosis or edema     Pulses:    2+ and symmetric all extremities     Skin:    Skin color, texture, turgor normal, no rashes or lesions     Lymph nodes:    Cervical, supraclavicular, and axillary nodes normal     Neurologic:    Normal strength, sensation and gait; reflexes 2+ and symmetric throughout                          Psych:  Normal mood, affect, hygiene and grooming    ASSESSMENT/PLAN:   Offer/decline COVID  Discussed monthly self breast exams and yearly mammograms; at least 30 minutes of aerobic activity at least 5 days/week, weight-bearing exercise 2-3x/week; proper sunscreen use reviewed; healthy diet, including goals of calcium and vitamin D intake and alcohol recommendations (less than or equal to 1 drink/day) reviewed; regular seatbelt use; changing batteries in smoke detectors.   Immunizations UTD, continue yearly flu shots.  COVID vaccine  Colonoscopy UTD, encouraged high fiber diet. Continue long-term PPI, since needed, and continue supplements/vitamins.  F/u 1 year, sooner prn

## 2023-12-31 ENCOUNTER — Ambulatory Visit: Payer: 59 | Admitting: Family Medicine

## 2023-12-31 ENCOUNTER — Encounter: Payer: Self-pay | Admitting: Family Medicine

## 2023-12-31 VITALS — BP 120/70 | HR 76 | Ht 63.0 in | Wt 111.8 lb

## 2023-12-31 DIAGNOSIS — Z23 Encounter for immunization: Secondary | ICD-10-CM

## 2023-12-31 DIAGNOSIS — K219 Gastro-esophageal reflux disease without esophagitis: Secondary | ICD-10-CM | POA: Diagnosis not present

## 2023-12-31 DIAGNOSIS — G4700 Insomnia, unspecified: Secondary | ICD-10-CM | POA: Diagnosis not present

## 2023-12-31 DIAGNOSIS — Z0000 Encounter for general adult medical examination without abnormal findings: Secondary | ICD-10-CM | POA: Diagnosis not present

## 2023-12-31 DIAGNOSIS — E042 Nontoxic multinodular goiter: Secondary | ICD-10-CM | POA: Diagnosis not present

## 2024-01-11 DIAGNOSIS — J3089 Other allergic rhinitis: Secondary | ICD-10-CM | POA: Diagnosis not present

## 2024-01-11 DIAGNOSIS — J301 Allergic rhinitis due to pollen: Secondary | ICD-10-CM | POA: Diagnosis not present

## 2024-01-11 DIAGNOSIS — J3081 Allergic rhinitis due to animal (cat) (dog) hair and dander: Secondary | ICD-10-CM | POA: Diagnosis not present

## 2024-01-25 DIAGNOSIS — J301 Allergic rhinitis due to pollen: Secondary | ICD-10-CM | POA: Diagnosis not present

## 2024-01-25 DIAGNOSIS — J3089 Other allergic rhinitis: Secondary | ICD-10-CM | POA: Diagnosis not present

## 2024-01-25 DIAGNOSIS — J3081 Allergic rhinitis due to animal (cat) (dog) hair and dander: Secondary | ICD-10-CM | POA: Diagnosis not present

## 2024-01-26 ENCOUNTER — Encounter: Payer: Self-pay | Admitting: Family Medicine

## 2024-02-08 DIAGNOSIS — J301 Allergic rhinitis due to pollen: Secondary | ICD-10-CM | POA: Diagnosis not present

## 2024-02-08 DIAGNOSIS — J3089 Other allergic rhinitis: Secondary | ICD-10-CM | POA: Diagnosis not present

## 2024-02-08 DIAGNOSIS — J3081 Allergic rhinitis due to animal (cat) (dog) hair and dander: Secondary | ICD-10-CM | POA: Diagnosis not present

## 2024-02-16 DIAGNOSIS — J3081 Allergic rhinitis due to animal (cat) (dog) hair and dander: Secondary | ICD-10-CM | POA: Diagnosis not present

## 2024-02-16 DIAGNOSIS — J301 Allergic rhinitis due to pollen: Secondary | ICD-10-CM | POA: Diagnosis not present

## 2024-02-16 DIAGNOSIS — J3089 Other allergic rhinitis: Secondary | ICD-10-CM | POA: Diagnosis not present

## 2024-03-16 ENCOUNTER — Other Ambulatory Visit (HOSPITAL_COMMUNITY): Payer: Self-pay

## 2024-03-16 DIAGNOSIS — J301 Allergic rhinitis due to pollen: Secondary | ICD-10-CM | POA: Diagnosis not present

## 2024-03-16 DIAGNOSIS — J3081 Allergic rhinitis due to animal (cat) (dog) hair and dander: Secondary | ICD-10-CM | POA: Diagnosis not present

## 2024-03-16 DIAGNOSIS — J3089 Other allergic rhinitis: Secondary | ICD-10-CM | POA: Diagnosis not present

## 2024-03-16 MED ORDER — EPINEPHRINE 0.3 MG/0.3ML IJ SOAJ
0.3000 mg | INTRAMUSCULAR | 1 refills | Status: AC | PRN
  Filled 2024-03-16: qty 2, 30d supply, fill #0

## 2024-03-24 ENCOUNTER — Other Ambulatory Visit: Payer: Self-pay | Admitting: *Deleted

## 2024-03-24 ENCOUNTER — Encounter: Payer: Self-pay | Admitting: Family Medicine

## 2024-03-24 ENCOUNTER — Other Ambulatory Visit (HOSPITAL_COMMUNITY): Payer: Self-pay

## 2024-03-24 DIAGNOSIS — J3089 Other allergic rhinitis: Secondary | ICD-10-CM | POA: Diagnosis not present

## 2024-03-24 DIAGNOSIS — J3081 Allergic rhinitis due to animal (cat) (dog) hair and dander: Secondary | ICD-10-CM | POA: Diagnosis not present

## 2024-03-24 DIAGNOSIS — J301 Allergic rhinitis due to pollen: Secondary | ICD-10-CM | POA: Diagnosis not present

## 2024-03-24 MED ORDER — PANTOPRAZOLE SODIUM 20 MG PO TBEC
20.0000 mg | DELAYED_RELEASE_TABLET | Freq: Every day | ORAL | 0 refills | Status: DC
  Filled 2024-03-24: qty 90, 90d supply, fill #0

## 2024-03-31 DIAGNOSIS — J3089 Other allergic rhinitis: Secondary | ICD-10-CM | POA: Diagnosis not present

## 2024-03-31 DIAGNOSIS — J301 Allergic rhinitis due to pollen: Secondary | ICD-10-CM | POA: Diagnosis not present

## 2024-03-31 DIAGNOSIS — J3081 Allergic rhinitis due to animal (cat) (dog) hair and dander: Secondary | ICD-10-CM | POA: Diagnosis not present

## 2024-04-06 DIAGNOSIS — J3089 Other allergic rhinitis: Secondary | ICD-10-CM | POA: Diagnosis not present

## 2024-04-06 DIAGNOSIS — J301 Allergic rhinitis due to pollen: Secondary | ICD-10-CM | POA: Diagnosis not present

## 2024-04-06 DIAGNOSIS — J3081 Allergic rhinitis due to animal (cat) (dog) hair and dander: Secondary | ICD-10-CM | POA: Diagnosis not present

## 2024-04-13 DIAGNOSIS — J3081 Allergic rhinitis due to animal (cat) (dog) hair and dander: Secondary | ICD-10-CM | POA: Diagnosis not present

## 2024-04-13 DIAGNOSIS — J301 Allergic rhinitis due to pollen: Secondary | ICD-10-CM | POA: Diagnosis not present

## 2024-04-13 DIAGNOSIS — J3089 Other allergic rhinitis: Secondary | ICD-10-CM | POA: Diagnosis not present

## 2024-04-19 DIAGNOSIS — J3089 Other allergic rhinitis: Secondary | ICD-10-CM | POA: Diagnosis not present

## 2024-04-19 DIAGNOSIS — J301 Allergic rhinitis due to pollen: Secondary | ICD-10-CM | POA: Diagnosis not present

## 2024-04-19 DIAGNOSIS — J3081 Allergic rhinitis due to animal (cat) (dog) hair and dander: Secondary | ICD-10-CM | POA: Diagnosis not present

## 2024-04-20 ENCOUNTER — Other Ambulatory Visit: Payer: Self-pay | Admitting: Family Medicine

## 2024-04-20 ENCOUNTER — Other Ambulatory Visit (HOSPITAL_COMMUNITY): Payer: Self-pay

## 2024-04-20 DIAGNOSIS — G4700 Insomnia, unspecified: Secondary | ICD-10-CM

## 2024-04-20 MED ORDER — ZOLPIDEM TARTRATE 10 MG PO TABS
5.0000 mg | ORAL_TABLET | Freq: Every evening | ORAL | 0 refills | Status: DC
  Filled 2024-04-20: qty 30, 30d supply, fill #0

## 2024-04-20 NOTE — Telephone Encounter (Signed)
 Last apt 12/31/23

## 2024-05-03 DIAGNOSIS — J301 Allergic rhinitis due to pollen: Secondary | ICD-10-CM | POA: Diagnosis not present

## 2024-05-03 DIAGNOSIS — J3089 Other allergic rhinitis: Secondary | ICD-10-CM | POA: Diagnosis not present

## 2024-05-03 DIAGNOSIS — J3081 Allergic rhinitis due to animal (cat) (dog) hair and dander: Secondary | ICD-10-CM | POA: Diagnosis not present

## 2024-05-18 DIAGNOSIS — J3081 Allergic rhinitis due to animal (cat) (dog) hair and dander: Secondary | ICD-10-CM | POA: Diagnosis not present

## 2024-05-18 DIAGNOSIS — J3089 Other allergic rhinitis: Secondary | ICD-10-CM | POA: Diagnosis not present

## 2024-05-18 DIAGNOSIS — J301 Allergic rhinitis due to pollen: Secondary | ICD-10-CM | POA: Diagnosis not present

## 2024-06-01 DIAGNOSIS — J301 Allergic rhinitis due to pollen: Secondary | ICD-10-CM | POA: Diagnosis not present

## 2024-06-01 DIAGNOSIS — J3089 Other allergic rhinitis: Secondary | ICD-10-CM | POA: Diagnosis not present

## 2024-06-01 DIAGNOSIS — J3081 Allergic rhinitis due to animal (cat) (dog) hair and dander: Secondary | ICD-10-CM | POA: Diagnosis not present

## 2024-06-14 DIAGNOSIS — J3081 Allergic rhinitis due to animal (cat) (dog) hair and dander: Secondary | ICD-10-CM | POA: Diagnosis not present

## 2024-06-14 DIAGNOSIS — J3089 Other allergic rhinitis: Secondary | ICD-10-CM | POA: Diagnosis not present

## 2024-06-14 DIAGNOSIS — J301 Allergic rhinitis due to pollen: Secondary | ICD-10-CM | POA: Diagnosis not present

## 2024-06-23 ENCOUNTER — Other Ambulatory Visit (HOSPITAL_COMMUNITY): Payer: Self-pay

## 2024-06-27 DIAGNOSIS — J3081 Allergic rhinitis due to animal (cat) (dog) hair and dander: Secondary | ICD-10-CM | POA: Diagnosis not present

## 2024-06-27 DIAGNOSIS — J3089 Other allergic rhinitis: Secondary | ICD-10-CM | POA: Diagnosis not present

## 2024-06-27 DIAGNOSIS — J301 Allergic rhinitis due to pollen: Secondary | ICD-10-CM | POA: Diagnosis not present

## 2024-06-30 ENCOUNTER — Other Ambulatory Visit (HOSPITAL_COMMUNITY): Payer: Self-pay

## 2024-06-30 ENCOUNTER — Other Ambulatory Visit: Payer: Self-pay | Admitting: Family Medicine

## 2024-07-01 ENCOUNTER — Other Ambulatory Visit (HOSPITAL_COMMUNITY): Payer: Self-pay

## 2024-07-01 ENCOUNTER — Other Ambulatory Visit: Payer: Self-pay

## 2024-07-01 MED ORDER — PANTOPRAZOLE SODIUM 20 MG PO TBEC
20.0000 mg | DELAYED_RELEASE_TABLET | Freq: Every day | ORAL | 0 refills | Status: DC
  Filled 2024-07-15: qty 90, 90d supply, fill #0

## 2024-07-01 MED ORDER — LEVOCETIRIZINE DIHYDROCHLORIDE 5 MG PO TABS
5.0000 mg | ORAL_TABLET | Freq: Every evening | ORAL | 2 refills | Status: AC
  Filled 2024-07-01: qty 90, 90d supply, fill #0
  Filled 2024-09-30 – 2024-10-10 (×2): qty 90, 90d supply, fill #1

## 2024-07-13 DIAGNOSIS — J3089 Other allergic rhinitis: Secondary | ICD-10-CM | POA: Diagnosis not present

## 2024-07-13 DIAGNOSIS — J301 Allergic rhinitis due to pollen: Secondary | ICD-10-CM | POA: Diagnosis not present

## 2024-07-13 DIAGNOSIS — J3081 Allergic rhinitis due to animal (cat) (dog) hair and dander: Secondary | ICD-10-CM | POA: Diagnosis not present

## 2024-07-15 ENCOUNTER — Other Ambulatory Visit: Payer: Self-pay

## 2024-07-15 ENCOUNTER — Other Ambulatory Visit (HOSPITAL_COMMUNITY): Payer: Self-pay

## 2024-07-27 DIAGNOSIS — J3089 Other allergic rhinitis: Secondary | ICD-10-CM | POA: Diagnosis not present

## 2024-07-27 DIAGNOSIS — J3081 Allergic rhinitis due to animal (cat) (dog) hair and dander: Secondary | ICD-10-CM | POA: Diagnosis not present

## 2024-07-27 DIAGNOSIS — J301 Allergic rhinitis due to pollen: Secondary | ICD-10-CM | POA: Diagnosis not present

## 2024-08-10 DIAGNOSIS — J3081 Allergic rhinitis due to animal (cat) (dog) hair and dander: Secondary | ICD-10-CM | POA: Diagnosis not present

## 2024-08-10 DIAGNOSIS — J3089 Other allergic rhinitis: Secondary | ICD-10-CM | POA: Diagnosis not present

## 2024-08-10 DIAGNOSIS — J301 Allergic rhinitis due to pollen: Secondary | ICD-10-CM | POA: Diagnosis not present

## 2024-09-30 ENCOUNTER — Other Ambulatory Visit: Payer: Self-pay | Admitting: Family Medicine

## 2024-09-30 ENCOUNTER — Other Ambulatory Visit: Payer: Self-pay

## 2024-10-02 MED ORDER — PANTOPRAZOLE SODIUM 20 MG PO TBEC
20.0000 mg | DELAYED_RELEASE_TABLET | Freq: Every day | ORAL | 0 refills | Status: AC
  Filled 2024-10-02 – 2024-10-10 (×2): qty 90, 90d supply, fill #0

## 2024-10-03 ENCOUNTER — Other Ambulatory Visit: Payer: Self-pay

## 2024-10-04 ENCOUNTER — Other Ambulatory Visit (HOSPITAL_COMMUNITY): Payer: Self-pay

## 2024-10-04 ENCOUNTER — Other Ambulatory Visit: Payer: Self-pay

## 2024-10-05 ENCOUNTER — Other Ambulatory Visit: Payer: Self-pay

## 2024-10-05 ENCOUNTER — Encounter: Payer: Self-pay | Admitting: Pharmacist

## 2024-10-06 ENCOUNTER — Other Ambulatory Visit (HOSPITAL_COMMUNITY): Payer: Self-pay

## 2024-10-06 ENCOUNTER — Other Ambulatory Visit: Payer: Self-pay | Admitting: Family Medicine

## 2024-10-06 DIAGNOSIS — G4700 Insomnia, unspecified: Secondary | ICD-10-CM

## 2024-10-06 MED ORDER — ZOLPIDEM TARTRATE 10 MG PO TABS
5.0000 mg | ORAL_TABLET | Freq: Every evening | ORAL | 0 refills | Status: AC
  Filled 2024-10-06: qty 30, 30d supply, fill #0

## 2024-10-06 NOTE — Telephone Encounter (Signed)
 Is this okay to refill?

## 2024-10-07 ENCOUNTER — Other Ambulatory Visit: Payer: Self-pay

## 2024-10-07 ENCOUNTER — Other Ambulatory Visit (HOSPITAL_COMMUNITY): Payer: Self-pay

## 2024-10-10 ENCOUNTER — Other Ambulatory Visit (HOSPITAL_COMMUNITY): Payer: Self-pay

## 2024-10-10 ENCOUNTER — Other Ambulatory Visit: Payer: Self-pay

## 2024-10-11 ENCOUNTER — Other Ambulatory Visit (HOSPITAL_COMMUNITY): Payer: Self-pay

## 2024-11-15 ENCOUNTER — Other Ambulatory Visit: Payer: Self-pay | Admitting: Family Medicine

## 2024-11-15 DIAGNOSIS — Z1231 Encounter for screening mammogram for malignant neoplasm of breast: Secondary | ICD-10-CM

## 2024-12-02 ENCOUNTER — Ambulatory Visit
Admission: RE | Admit: 2024-12-02 | Discharge: 2024-12-02 | Disposition: A | Source: Ambulatory Visit | Attending: Family Medicine | Admitting: Family Medicine

## 2024-12-02 DIAGNOSIS — Z1231 Encounter for screening mammogram for malignant neoplasm of breast: Secondary | ICD-10-CM

## 2025-01-18 ENCOUNTER — Encounter: Payer: 59 | Admitting: Family Medicine

## 2025-01-25 ENCOUNTER — Encounter: Payer: Self-pay | Admitting: Family Medicine
